# Patient Record
Sex: Male | Born: 1950 | Hispanic: No | Marital: Single | Smoking: Former smoker
Health system: Southern US, Community
[De-identification: ages and names within clinical notes are randomized; demographics above are authoritative.]

## PROBLEM LIST (undated history)

## (undated) DIAGNOSIS — K4090 Unilateral inguinal hernia, without obstruction or gangrene, not specified as recurrent: Secondary | ICD-10-CM

## (undated) DIAGNOSIS — E785 Hyperlipidemia, unspecified: Secondary | ICD-10-CM

## (undated) DIAGNOSIS — K5730 Diverticulosis of large intestine without perforation or abscess without bleeding: Secondary | ICD-10-CM

## (undated) DIAGNOSIS — Z973 Presence of spectacles and contact lenses: Secondary | ICD-10-CM

## (undated) DIAGNOSIS — Z862 Personal history of diseases of the blood and blood-forming organs and certain disorders involving the immune mechanism: Secondary | ICD-10-CM

## (undated) DIAGNOSIS — R030 Elevated blood-pressure reading, without diagnosis of hypertension: Secondary | ICD-10-CM

## (undated) DIAGNOSIS — Z8719 Personal history of other diseases of the digestive system: Secondary | ICD-10-CM

## (undated) HISTORY — DX: Hyperlipidemia, unspecified: E78.5

## (undated) HISTORY — PX: ANAL FISSURE REPAIR: SHX2312

## (undated) HISTORY — PX: HERNIA REPAIR: SHX51

## (undated) HISTORY — PX: OTHER SURGICAL HISTORY: SHX169

## (undated) HISTORY — PX: INGUINAL HERNIA REPAIR: SUR1180

## (undated) HISTORY — PX: COLONOSCOPY WITH PROPOFOL: SHX5780

## (undated) HISTORY — PX: TREATMENT FISTULA ANAL: SUR1390

---

## 2006-04-04 ENCOUNTER — Ambulatory Visit (HOSPITAL_COMMUNITY): Admission: RE | Admit: 2006-04-04 | Discharge: 2006-04-04 | Payer: Self-pay | Admitting: General Surgery

## 2007-04-25 ENCOUNTER — Emergency Department (HOSPITAL_COMMUNITY): Admission: EM | Admit: 2007-04-25 | Discharge: 2007-04-25 | Payer: Self-pay | Admitting: Emergency Medicine

## 2008-07-08 ENCOUNTER — Encounter: Admission: RE | Admit: 2008-07-08 | Discharge: 2008-07-08 | Payer: Self-pay | Admitting: Family Medicine

## 2010-06-18 NOTE — Op Note (Signed)
NAME:  Marc Tyler, Marc Tyler                ACCOUNT NO.:  [id number]   MEDICAL RECORD NO.:  [id number]          PATIENT TYPE:  AMB   LOCATION:  DAY                          FACILITY:  [facility]   PHYSICIAN:  Timothy E. Earlene Plater, M.D. 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   POSTOPERATIVE DIAGNOSIS:  Fistula in ano, hemorrhoids.   PROCEDURE:  Repair of fistula.   SURGEON:  Kendrick Ranch, M.D.   ANESTHESIA:  General.   [name] has been seen and followed in the office for some weeks.  He  began with a incision and drainage of a external thrombosed hemorrhoid  by his primary care which apparently later developed some element of  infection.  He was seen several times in the office.  I could not  distinguish pathology, however he did demonstrate a superficial fistula  in ano from the left lateral to the left anterior anoderm.  Because of  his concern and symptoms, he wants it repaired and that has been  carefully discussed with him on several occasions.  Today he is seen,  identified, and the permit signed.   He is taken to the operating room, placed supine.  General LMA  anesthesia provided.  He was placed in lithotomy.  Perianal area  inspected with magnification.  Anoscopy carried out.  He had prominent  external hemorrhoids, left lateral, right posterior but these were  known, the internal component was very slight.  In the left anterior the  junction of the rectal mucosa and anoderm was an exaggerated crypt.  I  inserted a small lacrimal duct cannula and that exited under the skin to  the left lateral perianal skin.  The tissue over this was divided with  cautery.  The resulting fistula tract was well cauterized.  There were  no complications.  No other evidence of disease process or infection.  This procedure was complete.  Gelfoam gauze and dry sterile dressing  applied.  Counts correct.  He tolerated it well, was removed to recovery  room.  The patient has Vicodin at home, instructions.      Timothy E. Earlene Plater, M.D.  Electronically Signed     TED/MEDQ  D:  04/04/2006  T:  04/04/2006  Job:  [id number]   cc:   [employer]

## 2010-10-25 LAB — BASIC METABOLIC PANEL
Chloride: 100
GFR calc Af Amer: >60
GFR calc non Af Amer: >60
Potassium: 4.4

## 2010-10-25 LAB — DIFFERENTIAL
Eosinophils Absolute: 0.1
Eosinophils Relative: 1
Lymphocytes Relative: 13
Lymphs Abs: 1.4
Monocytes Relative: 4
Neutrophils Relative %: 83 — ABNORMAL HIGH

## 2010-10-25 LAB — CBC
HCT: 48
MCV: 94.1
RBC: 5.1
WBC: 11.2 — ABNORMAL HIGH

## 2010-10-25 LAB — ETHANOL: Alcohol, Ethyl (B): <5

## 2014-08-01 DIAGNOSIS — Z86718 Personal history of other venous thrombosis and embolism: Secondary | ICD-10-CM

## 2014-08-01 HISTORY — DX: Personal history of other venous thrombosis and embolism: Z86.718

## 2014-08-22 ENCOUNTER — Encounter (HOSPITAL_COMMUNITY): Payer: Self-pay | Admitting: *Deleted

## 2014-08-22 ENCOUNTER — Inpatient Hospital Stay (HOSPITAL_COMMUNITY)
Admission: AD | Admit: 2014-08-22 | Discharge: 2014-08-27 | DRG: 441 | Disposition: A | Discharge status: Home / Self Care | Payer: 59 | Source: Other Acute Inpatient Hospital | Attending: Internal Medicine | Admitting: Internal Medicine

## 2014-08-22 DIAGNOSIS — E440 Moderate protein-calorie malnutrition: Secondary | ICD-10-CM

## 2014-08-22 DIAGNOSIS — Z87891 Personal history of nicotine dependence: Secondary | ICD-10-CM | POA: Diagnosis not present

## 2014-08-22 DIAGNOSIS — R160 Hepatomegaly, not elsewhere classified: Secondary | ICD-10-CM | POA: Diagnosis not present

## 2014-08-22 DIAGNOSIS — Z6823 Body mass index (BMI) 23.0-23.9, adult: Secondary | ICD-10-CM | POA: Diagnosis not present

## 2014-08-22 DIAGNOSIS — K769 Liver disease, unspecified: Principal | ICD-10-CM | POA: Diagnosis present

## 2014-08-22 DIAGNOSIS — R1084 Generalized abdominal pain: Secondary | ICD-10-CM | POA: Diagnosis not present

## 2014-08-22 DIAGNOSIS — F419 Anxiety disorder, unspecified: Secondary | ICD-10-CM | POA: Diagnosis not present

## 2014-08-22 DIAGNOSIS — D638 Anemia in other chronic diseases classified elsewhere: Secondary | ICD-10-CM | POA: Diagnosis not present

## 2014-08-22 DIAGNOSIS — C787 Secondary malignant neoplasm of liver and intrahepatic bile duct: Secondary | ICD-10-CM | POA: Diagnosis not present

## 2014-08-22 DIAGNOSIS — E43 Unspecified severe protein-calorie malnutrition: Secondary | ICD-10-CM | POA: Diagnosis present

## 2014-08-22 DIAGNOSIS — D630 Anemia in neoplastic disease: Secondary | ICD-10-CM | POA: Diagnosis present

## 2014-08-22 DIAGNOSIS — I81 Portal vein thrombosis: Secondary | ICD-10-CM | POA: Diagnosis present

## 2014-08-22 DIAGNOSIS — R7989 Other specified abnormal findings of blood chemistry: Secondary | ICD-10-CM | POA: Diagnosis not present

## 2014-08-22 DIAGNOSIS — R109 Unspecified abdominal pain: Secondary | ICD-10-CM | POA: Diagnosis present

## 2014-08-22 DIAGNOSIS — E785 Hyperlipidemia, unspecified: Secondary | ICD-10-CM | POA: Diagnosis not present

## 2014-08-22 MED ORDER — FAMOTIDINE 20 MG PO TABS: 20.0000 mg | ORAL_TABLET | Freq: Every day | ORAL | Status: DC | PRN

## 2014-08-22 MED ORDER — PANTOPRAZOLE SODIUM 40 MG PO TBEC
80.0000 mg | DELAYED_RELEASE_TABLET | Freq: Every day | ORAL | Status: DC
  Administered 2014-08-23 – 2014-08-27 (×5): 80 mg via ORAL
  Filled 2014-08-22 (×5): qty 2

## 2014-08-22 MED ORDER — ENSURE ENLIVE PO LIQD
237.0000 mL | Freq: Two times a day (BID) | ORAL | Status: DC
  Administered 2014-08-23 – 2014-08-26 (×6): 237 mL via ORAL

## 2014-08-22 MED ORDER — HYDROCODONE-ACETAMINOPHEN 5-325 MG PO TABS
1.0000 | ORAL_TABLET | ORAL | Status: DC | PRN
  Administered 2014-08-25: 2 via ORAL
  Administered 2014-08-26: 1 via ORAL
  Filled 2014-08-22: qty 1
  Filled 2014-08-22: qty 2

## 2014-08-22 MED ORDER — ATORVASTATIN CALCIUM 10 MG PO TABS
20.0000 mg | ORAL_TABLET | Freq: Every day | ORAL | Status: DC
  Administered 2014-08-22 – 2014-08-27 (×6): 20 mg via ORAL
  Filled 2014-08-22 (×6): qty 2

## 2014-08-22 MED ORDER — BUPROPION HCL ER (XL) 150 MG PO TB24
150.0000 mg | ORAL_TABLET | Freq: Every day | ORAL | Status: DC
  Administered 2014-08-23: 150 mg via ORAL
  Filled 2014-08-22 (×3): qty 1

## 2014-08-22 MED ORDER — ALPRAZOLAM 0.25 MG PO TABS
0.2500 mg | ORAL_TABLET | Freq: Three times a day (TID) | ORAL | Status: DC | PRN
  Administered 2014-08-22 – 2014-08-24 (×5): 0.5 mg via ORAL
  Filled 2014-08-22 (×5): qty 2

## 2014-08-22 MED ORDER — HEPARIN (PORCINE) IN NACL 100-0.45 UNIT/ML-% IJ SOLN
1200.0000 [IU]/h | INTRAMUSCULAR | Status: DC
Start: 2014-08-22 — End: 2014-08-23
  Administered 2014-08-22: 1200 [IU]/h via INTRAVENOUS

## 2014-08-22 NOTE — Progress Notes (Signed)
64 year old gentleman presented to [facility] center with 3 weeks of abdominal pain. On arrival and further evaluation with imaging revealed, multiple liver masses/ lesions and portal vein thrombosis. There ia an area of thickened sigmoid colon.  He was started on IV heparin and requesting transfer to [facility] for evaluation of malignancy and colonoscopy. Will need GI consult and oncology consult on arrival.   Hosie Poisson, MD [phone].

## 2014-08-22 NOTE — H&P (Signed)
[facility] History and Physical  Marc Tyler MVE:[id number] DOB: 1950-12-19 DOA: 08/22/2014  Referring physician: EDP PCP: No primary care provider on file.   Chief Complaint: Abdominal pain   HPI: Marc Tyler is a 64 y.o. male who presented to [facility] with 3 week history of abdominal pain.  Work up at [facility] included CT abd/pelvis which revealed multiple liver lesions / masses, as well as portal vein thrombosis.  Has area of sigmoid colon with thickening (? Primary). Patient transferred to [facility] for availability of GI and ultimately oncology consults.  Review of Systems: Systems reviewed.  As above, otherwise negative  No past medical history on file. No past surgical history on file. Social History:  has no tobacco, alcohol, and drug history on file.  No Known Allergies  No family history on file.   Prior to Admission medications   Medication Sig Start Date End Date Taking? Authorizing Provider  ALPRAZolam Duanne Moron) 0.25 MG tablet Take 1-2 tablets by mouth 3 (three) times daily as needed. anxiety 08/19/14  Yes Historical Provider, MD  Ascorbic Acid (VITAMIN C PO) Take 1 tablet by mouth daily.   Yes Historical Provider, MD  atorvastatin (LIPITOR) 20 MG tablet Take 20 mg by mouth daily. 08/09/14  Yes Historical Provider, MD  buPROPion (WELLBUTRIN XL) 150 MG 24 hr tablet Take 1 tablet by mouth daily. 08/14/14  Yes Historical Provider, MD  famotidine (PEPCID) 20 MG tablet Take 20 mg by mouth daily as needed for heartburn or indigestion.   Yes Historical Provider, MD  Melatonin 3 MG CAPS Take 1 capsule by mouth at bedtime as needed (sleep).   Yes Historical Provider, MD  Multiple Vitamins-Minerals (MULTIVITAMIN WITH MINERALS) tablet Take 1 tablet by mouth daily.   Yes Historical Provider, MD  omeprazole (PRILOSEC) 20 MG capsule Take 20 mg by mouth daily.   Yes Historical Provider, MD   Physical Exam: Filed Vitals:   08/22/14 2046  BP: 129/73  Pulse: 91  Temp: 97.6 F  (36.4 C)  Resp: 16    BP 129/73 mmHg  Pulse 91  Temp(Src) 97.6 F (36.4 C) (Oral)  Resp 16  Ht 5\' 11"  (1.803 m)  Wt 76.613 kg (168 lb 14.4 oz)  BMI 23.57 kg/m2  SpO2 100%  General Appearance:    Alert, oriented, no distress, appears stated age  Head:    Normocephalic, atraumatic  Eyes:    PERRL, EOMI, sclera non-icteric        Nose:   Nares without drainage or epistaxis. Mucosa, turbinates normal  Throat:   Moist mucous membranes. Oropharynx without erythema or exudate.  Neck:   Supple. No carotid bruits.  No thyromegaly.  No lymphadenopathy.   Back:     No CVA tenderness, no spinal tenderness  Lungs:     Clear to auscultation bilaterally, without wheezes, rhonchi or rales  Chest wall:    No tenderness to palpitation  Heart:    Regular rate and rhythm without murmurs, gallops, rubs  Abdomen:     Soft, non-tender, nondistended, normal bowel sounds, no organomegaly  Genitalia:    deferred  Rectal:    deferred  Extremities:   No clubbing, cyanosis or edema.  Pulses:   2+ and symmetric all extremities  Skin:   Skin color, texture, turgor normal, no rashes or lesions  Lymph nodes:   Cervical, supraclavicular, and axillary nodes normal  Neurologic:   CNII-XII intact. Normal strength, sensation and reflexes      throughout    Labs on  Admission:  Basic Metabolic Panel: No results for input(s): NA, K, CL, CO2, GLUCOSE, BUN, CREATININE, CALCIUM, MG, PHOS in the last 168 hours. Liver Function Tests: No results for input(s): AST, ALT, ALKPHOS, BILITOT, PROT, ALBUMIN in the last 168 hours. No results for input(s): LIPASE, AMYLASE in the last 168 hours. No results for input(s): AMMONIA in the last 168 hours. CBC: No results for input(s): WBC, NEUTROABS, HGB, HCT, MCV, PLT in the last 168 hours. Cardiac Enzymes: No results for input(s): CKTOTAL, CKMB, CKMBINDEX, TROPONINI in the last 168 hours.  BNP (last 3 results) No results for input(s): PROBNP in the last 8760 hours. CBG: No  results for input(s): GLUCAP in the last 168 hours.  Radiological Exams on Admission: No results found.  EKG: Independently reviewed.  Assessment/Plan Principal Problem:   Liver masses Active Problems:   Portal vein thrombosis   1. Liver masses - primary liver vs mets from another primary malignancy (eg sigmoid colon cancer as he has thickening of the sigmoid colon on CT scan). 1. Needs GI consult in AM re: liver biopsy vs colonoscopy (primary possibly in sigmoid colon given area of thickening on CT scan). 2. Norco for pain 3. Will leave on diet for now 2. Portal vein thrombosis -  1. Heparin gtt per pharm consult    Code Status: Full Code  Family Communication: No family in room Disposition Plan: Admit to inpatient   Time spent: 70 min  GARDNER, JARED M. [facility] Pager [phone]  If 7AM-7PM, please contact the day team taking care of the patient [email or web address] Password [email or web address] 08/22/2014, 9:30 PM

## 2014-08-22 NOTE — Progress Notes (Signed)
ANTICOAGULATION CONSULT NOTE - Initial Consult  Pharmacy Consult for Heparin Indication: portal vein thrombosis  No Known Allergies  Patient Measurements: Height: 5\' 11"  (180.3 cm) Weight: 168 lb 14.4 oz (76.613 kg) IBW/kg (Calculated) : 75.3 Heparin Dosing Weight: using actual body weight  Vital Signs: Temp: 97.6 F (36.4 C) (07/22 2046) Temp Source: Oral (07/22 2046) BP: 129/73 mmHg (07/22 2046) Pulse Rate: 91 (07/22 2046)  Labs: No results for input(s): HGB, HCT, PLT, APTT, LABPROT, INR, HEPARINUNFRC, CREATININE, CKTOTAL, CKMB, TROPONINI in the last 72 hours.  CrCl cannot be calculated (Patient has no serum creatinine result on file.).   Medical History: No past medical history on file.   Assessment: 19 yoM presented to [facility] center with 3 weeks of abdominal pain. Found to have multiple liver masses/lesions, thickened sigmoid colon, and extensive portal vein thrombosis. He was started on IV heparin and transferred [facility] for evaluation of malignancy and colonoscopy.  Pharmacy consulted to continue management of heparin.  Per Care Everywhere, appears that patient was given a 4500 unit bolus of heparin and the infusion was ordered.  RN reports that patient arrived with infusion running at 1200 units/hr.  Unknown time of start.  Care Everywhere Labs  Hgb 9.0, Platelets 413  SCr 0.89, CrCl~93 ml/min  No baseline anticoagulation labs available  Goal of Therapy:  Heparin level 0.3-0.7 units/ml Monitor platelets by anticoagulation protocol: Yes   Plan:  Waiting on RN to call back to confirm heparin rate.  Will place inpatient order for this and order heparin level.   Hershal Coria 08/22/2014,9:38 PM

## 2014-08-23 ENCOUNTER — Encounter (HOSPITAL_COMMUNITY): Payer: Self-pay | Admitting: *Deleted

## 2014-08-23 DIAGNOSIS — R7989 Other specified abnormal findings of blood chemistry: Secondary | ICD-10-CM

## 2014-08-23 DIAGNOSIS — R1084 Generalized abdominal pain: Secondary | ICD-10-CM

## 2014-08-23 LAB — CBC
HEMATOCRIT: 23 % — AB (ref 39.0–52.0)
HEMOGLOBIN: 7.6 g/dL — AB (ref 13.0–17.0)
MCH: 31.1 pg (ref 26.0–34.0)
MCHC: 33 g/dL (ref 30.0–36.0)
MCV: 94.3 fL (ref 78.0–100.0)
PLATELETS: 366 10*3/uL (ref 150–400)
RBC: 2.44 MIL/uL — ABNORMAL LOW (ref 4.22–5.81)
RDW: 14.7 % (ref 11.5–15.5)
WBC: 11.1 10*3/uL — ABNORMAL HIGH (ref 4.0–10.5)

## 2014-08-23 LAB — COMPREHENSIVE METABOLIC PANEL
ALBUMIN: 1.9 g/dL — AB (ref 3.5–5.0)
ALT: 57 U/L (ref 17–63)
AST: 46 U/L — AB (ref 15–41)
Alkaline Phosphatase: 224 U/L — ABNORMAL HIGH (ref 38–126)
Anion gap: 6 (ref 5–15)
BILIRUBIN TOTAL: 2.7 mg/dL — AB (ref 0.3–1.2)
BUN: 14 mg/dL (ref 6–20)
CALCIUM: 7.9 mg/dL — AB (ref 8.9–10.3)
CHLORIDE: 103 mmol/L (ref 101–111)
CO2: 25 mmol/L (ref 22–32)
CREATININE: 0.92 mg/dL (ref 0.61–1.24)
GFR calc Af Amer: >60 mL/min (ref >60)
Glucose, Bld: 130 mg/dL — ABNORMAL HIGH (ref 65–99)
Potassium: 4.2 mmol/L (ref 3.5–5.1)
SODIUM: 134 mmol/L — AB (ref 135–145)
Total Protein: 6.1 g/dL — ABNORMAL LOW (ref 6.5–8.1)

## 2014-08-23 LAB — HEPARIN LEVEL (UNFRACTIONATED)
HEPARIN UNFRACTIONATED: 0.2 [IU]/mL — AB (ref 0.30–0.70)
Heparin Unfractionated: 0.12 IU/mL — ABNORMAL LOW (ref 0.30–0.70)

## 2014-08-23 LAB — ABO/RH: ABO/RH(D): O POS

## 2014-08-23 LAB — PREPARE RBC (CROSSMATCH)

## 2014-08-23 MED ORDER — GUAIFENESIN 100 MG/5ML PO SOLN
200.0000 mg | ORAL | Status: DC | PRN
  Administered 2014-08-23 – 2014-08-24 (×5): 200 mg via ORAL
  Filled 2014-08-23 (×6): qty 10

## 2014-08-23 MED ORDER — SODIUM CHLORIDE 0.9 % IV SOLN
Freq: Once | INTRAVENOUS | Status: AC
  Administered 2014-08-23: 18:00:00 via INTRAVENOUS

## 2014-08-23 MED ORDER — HEPARIN BOLUS VIA INFUSION
1500.0000 [IU] | Freq: Once | INTRAVENOUS | Status: AC
Start: 2014-08-23 — End: 2014-08-23
  Administered 2014-08-23: 1500 [IU] via INTRAVENOUS
  Filled 2014-08-23: qty 1500

## 2014-08-23 MED ORDER — HEPARIN (PORCINE) IN NACL 100-0.45 UNIT/ML-% IJ SOLN
1750.0000 [IU]/h | INTRAMUSCULAR | Status: DC
  Administered 2014-08-23: 1350 [IU]/h via INTRAVENOUS
  Filled 2014-08-23: qty 250

## 2014-08-23 NOTE — Progress Notes (Signed)
ANTICOAGULATION CONSULT NOTE - Follow up  Pharmacy Consult for Heparin Indication: portal vein thrombosis  No Known Allergies  Patient Measurements: Height: 5\' 11"  (180.3 cm) Weight: 168 lb 14.4 oz (76.613 kg) IBW/kg (Calculated) : 75.3 Heparin Dosing Weight: using actual body weight  Vital Signs: Temp: 97.8 F (36.6 C) (07/23 0443) Temp Source: Oral (07/23 0443) BP: 116/70 mmHg (07/23 0443) Pulse Rate: 91 (07/23 0443)  Labs:  Recent Labs  08/23/14 0300 08/23/14 1203  HGB 7.6*  --   HCT 23.0*  --   PLT 366  --   HEPARINUNFRC <0.10* 0.12*  CREATININE 0.92  --     Estimated Creatinine Clearance: 88.7 mL/min (by C-G formula based on Cr of 0.92).   Medical History: Past Medical History  Diagnosis Date  . Hyperlipidemia    Assessment: 7 yoM presented to [facility] center with 3 weeks of abdominal pain. Found to have multiple liver masses/lesions, thickened sigmoid colon, and extensive portal vein thrombosis. He was started on IV heparin and transferred to [facility] for evaluation of malignancy and colonoscopy. Pharmacy consulted to continue management of heparin. Per Care Everywhere, appears that he was given a 4500 unit bolus of heparin and the infusion was ordered.  RN reports that patient arrived with infusion running at 1200 units/hr. Unknown time of start.   Heparin level remains subtherapeutic after rebolus and rate increase to 1350units/hr. No bleeding reported/documented. Per RN, no problems with the IV site and no interruptions in the infusion.  Goal of Therapy:  Heparin level 0.3-0.7 units/ml Monitor platelets by anticoagulation protocol: Yes   Plan:  Increase heparin infusion to 1750units/hr.  Recheck heparin level in 6 hours and daily thereafter. Check CBC q24h while on heparin. F/u daily.  Romeo Rabon, PharmD, pager [phone]. 08/23/2014,12:54 PM.

## 2014-08-23 NOTE — Progress Notes (Signed)
ANTICOAGULATION CONSULT NOTE - Follow Up Consult  Pharmacy Consult for Heparin Indication: portal vein thrombosis  No Known Allergies  Patient Measurements: Height: 5\' 11"  (180.3 cm) Weight: 168 lb 14.4 oz (76.613 kg) IBW/kg (Calculated) : 75.3 Heparin Dosing Weight:   Vital Signs: Temp: 97.8 F (36.6 C) (07/23 0443) Temp Source: Oral (07/23 0443) BP: 116/70 mmHg (07/23 0443) Pulse Rate: 91 (07/23 0443)  Labs:  Recent Labs  08/23/14 0300  HGB 7.6*  HCT 23.0*  PLT 366  HEPARINUNFRC <0.10*  CREATININE 0.92    Estimated Creatinine Clearance: 88.7 mL/min (by C-G formula based on Cr of 0.92).   Medications:  Infusions:  . heparin 1,200 Units/hr (08/22/14 2230)  . heparin      Assessment: Patient with low heparin level.  No heparin issues per RN.    Goal of Therapy:  Heparin level 0.3-0.7 units/ml Monitor platelets by anticoagulation protocol: Yes   Plan:  Increase heparin to 1350 units/hr Rebolus 1500 units iv x1 Recheck level at [address] [place] 08/23/2014,4:53 AM

## 2014-08-23 NOTE — Progress Notes (Signed)
Utilization Review completed. Adham Johnson RN BSN CM 

## 2014-08-23 NOTE — Progress Notes (Addendum)
Patient ID: Marc Tyler, male   DOB: Jul 04, 1950, 64 y.o.   MRN: [id number] [facility] PROGRESS NOTE  Marc Tyler [id number] DOB: 31-Oct-1950 DOA: 08/22/2014 PCP:  Will ask CM to provide info on [facility]  Brief narrative:    64 y.o. male with past medical history of dyslipidemia, has had colonoscopy about 5 years ago and said it was normal. He says he has been feeling very weak over last 3 weeks, has had gnawing type of abdominal pain which initially he contributed to anxiety. The pain was generalized, intermittent and has caused him to have poor po intake. As a result, he has lost 10 lbs in last 2 weeks. He was seen by his PCP when he started having these issues and was even prescribed abx for possible pneumonia. He presented from [place] for further evaluation of multiple liver lesions and masses seen on CT abdomen along with portal vein thrombosis. He was started on heparin drip.  Barrier to discharge: will need liver biopsy for now. He will continue heparin drip for portal vein thrombosis. Once biopsy results are available he will need oncology consultation for treatment/management plan.   Assessment/Plan:    Principal Problem: Abdominal pain / Liver lesions concerning for metastases  - On CT scan, there was an area of sigmoid colon thickening and multiple liver lesion concerning for metastatic disease.  - IR consulted for biopsy of liver lesion(s) - Spoke with Gi, they recommend liver biopsy prior to other diagnostic studies. - Continue pain management efforts  Active Problems: Portal vein thrombosis - Continue heparin drip  Anemia of chronic disease - Likely in the setting of malignancy - Will give 1 unit PRBC now  - Check CBC tomorrow am  Abnormal liver function tests - Likely from liver mets   DVT Prophylaxis  - On heparin drip    Code Status: Full.  Family Communication:  plan of care discussed with the patient and his friend at the bedside   Disposition Plan: Home once the work is completed, liver biopsy, ? colonoscopy  IV access:  Peripheral IV  Procedures and diagnostic studies:    No results found.  Medical Consultants:  Interventional radiology - for liver biopsy GI - Dr. Paulita Fujita  Other Consultants:  None  IAnti-Infectives:   None    Leisa Lenz, MD  [facility] Pager [phone]  Time spent in minutes: 25 minutes  If 7PM-7AM, please contact night-coverage [email or web address] Password [facility] 08/23/2014, 2:39 PM   LOS: 1 day    HPI/Subjective: No acute overnight events. Patient reports abdominal pain is controlled.   Objective: Filed Vitals:   08/22/14 1900 08/22/14 2046 08/23/14 0443  BP: 126/73 129/73 116/70  Pulse: 104 91 91  Temp: 97.9 F (36.6 C) 97.6 F (36.4 C) 97.8 F (36.6 C)  TempSrc: Oral Oral Oral  Resp: 16 16 16   Height: 5\' 11"  (1.803 m)    Weight: 76.613 kg (168 lb 14.4 oz)    SpO2: 100% 100% 100%    Intake/Output Summary (Last 24 hours) at 08/23/14 1439 Last data filed at 08/23/14 0313  Gross per 24 hour  Intake      0 ml  Output    400 ml  Net   -400 ml    Exam:   General:  Pt is alert, follows commands appropriately, not in acute distress  Cardiovascular: Regular rate and rhythm, S1/S2, no murmurs  Respiratory: Clear to auscultation bilaterally, no wheezing, no crackles, no rhonchi  Abdomen: Soft, non tender,  non distended, bowel sounds present  Extremities: No edema, pulses DP and PT palpable bilaterally  Neuro: Grossly nonfocal  Data Reviewed: Basic Metabolic Panel:  Recent Labs Lab 08/23/14 0300  NA 134*  K 4.2  CL 103  CO2 25  GLUCOSE 130*  BUN 14  CREATININE 0.92  CALCIUM 7.9*   Liver Function Tests:  Recent Labs Lab 08/23/14 0300  AST 46*  ALT 57  ALKPHOS 224*  BILITOT 2.7*  PROT 6.1*  ALBUMIN 1.9*   No results for input(s): LIPASE, AMYLASE in the last 168 hours. No results for input(s): AMMONIA in the last 168  hours. CBC:  Recent Labs Lab 08/23/14 0300  WBC 11.1*  HGB 7.6*  HCT 23.0*  MCV 94.3  PLT 366   Cardiac Enzymes: No results for input(s): CKTOTAL, CKMB, CKMBINDEX, TROPONINI in the last 168 hours. BNP: Invalid input(s): POCBNP CBG: No results for input(s): GLUCAP in the last 168 hours.  No results found for this or any previous visit (from the past 240 hour(s)).   Scheduled Meds: . atorvastatin  20 mg Oral Daily  . buPROPion  150 mg Oral Daily  . feeding supplement (ENSURE ENLIVE)  237 mL Oral BID BM  . pantoprazole  80 mg Oral Daily   Continuous Infusions: . heparin 1,750 Units/hr (08/23/14 1301)

## 2014-08-23 NOTE — Progress Notes (Signed)
Pt explained to patient the need for blood transfusion as requested by MD. Pt very hesitant and unsure if he should agree. Dr. Charlies Silvers paged to come speak with patient. Type and screen collected and sent in the mean time. Pt given consent for to review.

## 2014-08-24 ENCOUNTER — Encounter (HOSPITAL_COMMUNITY): Payer: Self-pay | Admitting: Radiology

## 2014-08-24 DIAGNOSIS — R160 Hepatomegaly, not elsewhere classified: Secondary | ICD-10-CM

## 2014-08-24 DIAGNOSIS — E440 Moderate protein-calorie malnutrition: Secondary | ICD-10-CM

## 2014-08-24 DIAGNOSIS — C787 Secondary malignant neoplasm of liver and intrahepatic bile duct: Secondary | ICD-10-CM | POA: Diagnosis present

## 2014-08-24 DIAGNOSIS — I81 Portal vein thrombosis: Secondary | ICD-10-CM

## 2014-08-24 LAB — CBC
HEMATOCRIT: 26.5 % — AB (ref 39.0–52.0)
HEMOGLOBIN: 8.6 g/dL — AB (ref 13.0–17.0)
MCH: 30.6 pg (ref 26.0–34.0)
MCHC: 32.5 g/dL (ref 30.0–36.0)
MCV: 94.3 fL (ref 78.0–100.0)
Platelets: 355 10*3/uL (ref 150–400)
RBC: 2.81 MIL/uL — ABNORMAL LOW (ref 4.22–5.81)
RDW: 15 % (ref 11.5–15.5)
WBC: 10.8 10*3/uL — AB (ref 4.0–10.5)

## 2014-08-24 LAB — HEPARIN LEVEL (UNFRACTIONATED)
HEPARIN UNFRACTIONATED: 0.41 [IU]/mL (ref 0.30–0.70)
Heparin Unfractionated: 0.34 IU/mL (ref 0.30–0.70)

## 2014-08-24 MED ORDER — HEPARIN (PORCINE) IN NACL 100-0.45 UNIT/ML-% IJ SOLN
1950.0000 [IU]/h | INTRAMUSCULAR | Status: DC
  Administered 2014-08-24 – 2014-08-26 (×6): 1950 [IU]/h via INTRAVENOUS
  Filled 2014-08-24 (×6): qty 250

## 2014-08-24 MED ORDER — BUPROPION HCL ER (XL) 300 MG PO TB24
300.0000 mg | ORAL_TABLET | Freq: Every day | ORAL | Status: DC
  Administered 2014-08-24 – 2014-08-27 (×4): 300 mg via ORAL
  Filled 2014-08-24 (×4): qty 1

## 2014-08-24 NOTE — Progress Notes (Signed)
ANTICOAGULATION CONSULT NOTE - Follow Up Consult  Pharmacy Consult for Heparin Indication: portal vein thrombosis  No Known Allergies  Patient Measurements: Height: 5\' 11"  (180.3 cm) Weight: 168 lb 14.4 oz (76.613 kg) IBW/kg (Calculated) : 75.3 Heparin Dosing Weight:   Vital Signs: Temp: 99.9 F (37.7 C) (07/23 2101) Temp Source: Oral (07/23 2101) BP: 128/74 mmHg (07/23 2101) Pulse Rate: 88 (07/23 2101)  Labs:  Recent Labs  08/23/14 0300 08/23/14 1203 08/23/14 2303  HGB 7.6*  --   --   HCT 23.0*  --   --   PLT 366  --   --   HEPARINUNFRC <0.10* 0.12* 0.20*  CREATININE 0.92  --   --     Estimated Creatinine Clearance: 88.7 mL/min (by C-G formula based on Cr of 0.92).   Medications:  Infusions:  . heparin      Assessment: Patient with heparin level low.  No heparin issues per RN.  Goal of Therapy:  Heparin level 0.3-0.7 units/ml Monitor platelets by anticoagulation protocol: Yes   Plan:  Increase heparin to 1950 units/hr Recheck level at [place] 08/24/2014,12:32 AM

## 2014-08-24 NOTE — Care Management Note (Signed)
Case Management Note  Patient Details  Name: Marc Tyler MRN: [id number] Date of Birth: [birth year]  Subjective/Objective:                    Action/Plan:  Discharge planning   Expected Discharge Date:                  Expected Discharge Plan:  Home/Self Care  In-House Referral:     Discharge planning Services  CM Consult [facility] per physician request)  Post Acute Care Choice:    Choice offered to:     DME Arranged:    DME Agency:     HH Arranged:    [place] Agency:     Status of Service:  Completed, signed off  Medicare Important Message Given:    Date Medicare IM Given:    Medicare IM give by:    Date Additional Medicare IM Given:    Additional Medicare Important Message give by:     If discussed at [place] of Stay Meetings, dates discussed:    Additional Comments: CM spoke with patient at the bedside. Provided patient with the [place] information sheet. Patient states he has a PCP.  Apolonio Schneiders, RN 08/24/2014, 11:37 AM

## 2014-08-24 NOTE — Progress Notes (Signed)
ANTICOAGULATION CONSULT NOTE - Follow up  Pharmacy Consult for Heparin Indication: portal vein thrombosis  No Known Allergies  Patient Measurements: Height: 5\' 11"  (180.3 cm) Weight: 168 lb 14.4 oz (76.613 kg) IBW/kg (Calculated) : 75.3 Heparin Dosing Weight: using actual body weight  Vital Signs: Temp: 98.8 F (37.1 C) (07/24 0454) Temp Source: Oral (07/24 0454) BP: 123/80 mmHg (07/24 0454) Pulse Rate: 99 (07/24 0454)  Labs:  Recent Labs  08/23/14 0300  08/23/14 2303 08/24/14 0432 08/24/14 0827 08/24/14 1404  HGB 7.6*  --   --  8.6*  --   --   HCT 23.0*  --   --  26.5*  --   --   PLT 366  --   --  355  --   --   HEPARINUNFRC <0.10*  < > 0.20*  --  0.34 0.41  CREATININE 0.92  --   --   --   --   --   < > = values in this interval not displayed.  Estimated Creatinine Clearance: 88.7 mL/min (by C-G formula based on Cr of 0.92).   Medical History: Past Medical History  Diagnosis Date  . Hyperlipidemia    Assessment: 65 yoM presented to [facility] center with 3 weeks of abdominal pain. Found to have multiple liver masses/lesions, thickened sigmoid colon, and extensive portal vein thrombosis. He was started on IV heparin and transferred to [facility] for evaluation of malignancy and colonoscopy. Pharmacy consulted to continue management of heparin.    Heparin level therapeutic x 2 on 1950units/hr.   CBC improved post-transfusion. No bleeding reported/documented.  Per RN, no problems with the IV site and no interruptions in the infusion.  Goal of Therapy:  Heparin level 0.3-0.7 units/ml Monitor platelets by anticoagulation protocol: Yes   Plan:  Cont heparin infusion at 1950units/hr.  Daily heparin level. Check CBC q24h while on heparin. F/u daily.  Romeo Rabon, PharmD, pager [phone]. 08/24/2014,2:37 PM.

## 2014-08-24 NOTE — H&P (Signed)
Chief Complaint: Patient was seen in consultation today for liver lesions at the request of TRH/GI  Referring Physician(s): Dr. Charlies Silvers  History of Present Illness: Marc Tyler is a 64 y.o. male who presented after being seen in [place] for c/o fatigue, weight loss and abdominal pain. CT done at [facility] revealed multiple liver lesions and portal vein thrombosis, the patient was placed on IV heparin and transferred to [facility] for further workup-GI contacted and recommended biopsy. IR received request for biopsy. The patient admits to vague abdominal pain, denies any RUQ pain. He denies any chest pain, shortness of breath or palpitations. He denies any active signs of bleeding or excessive bruising. He denies any recent fever or chills. The patient denies any history of sleep apnea or chronic oxygen use. He has previously tolerated sedation without complications.     Past Medical History  Diagnosis Date  . Hyperlipidemia     Past Surgical History  Procedure Laterality Date  . Hernia repair      Allergies: Review of patient's allergies indicates no known allergies.  Medications: Prior to Admission medications   Medication Sig Start Date End Date Taking? Authorizing Provider  ALPRAZolam Duanne Moron) 0.25 MG tablet Take 1-2 tablets by mouth 3 (three) times daily as needed. anxiety 08/19/14  Yes Historical Provider, MD  Ascorbic Acid (VITAMIN C PO) Take 1 tablet by mouth daily.   Yes Historical Provider, MD  atorvastatin (LIPITOR) 20 MG tablet Take 20 mg by mouth daily. 08/09/14  Yes Historical Provider, MD  buPROPion (WELLBUTRIN XL) 150 MG 24 hr tablet Take 1 tablet by mouth daily. 08/14/14  Yes Historical Provider, MD  famotidine (PEPCID) 20 MG tablet Take 20 mg by mouth daily as needed for heartburn or indigestion.   Yes Historical Provider, MD  Melatonin 3 MG CAPS Take 1 capsule by mouth at bedtime as needed (sleep).   Yes Historical Provider, MD  Multiple Vitamins-Minerals  (MULTIVITAMIN WITH MINERALS) tablet Take 1 tablet by mouth daily.   Yes Historical Provider, MD  omeprazole (PRILOSEC) 20 MG capsule Take 20 mg by mouth daily.   Yes Historical Provider, MD     History reviewed. No pertinent family history.  History   Social History  . Marital Status: Single    Spouse Name: N/A  . Number of Children: N/A  . Years of Education: N/A   Social History Main Topics  . Smoking status: Former Research scientist (life sciences)  . Smokeless tobacco: Not on file  . Alcohol Use: Not on file  . Drug Use: Not on file  . Sexual Activity: Not on file   Other Topics Concern  . None   Social History Narrative   Review of Systems: A 12 point ROS discussed and pertinent positives are indicated in the HPI above.  All other systems are negative.  Review of Systems  Vital Signs: BP 123/80 mmHg  Pulse 99  Temp(Src) 98.8 F (37.1 C) (Oral)  Resp 18  Ht 5\' 11"  (1.803 m)  Wt 168 lb 14.4 oz (76.613 kg)  BMI 23.57 kg/m2  SpO2 98%  Physical Exam  Constitutional: He is oriented to person, place, and time. No distress.  HENT:  Head: Normocephalic and atraumatic.  Neck: No tracheal deviation present.  Cardiovascular: Normal rate and regular rhythm.  Exam reveals no gallop and no friction rub.   No murmur heard. Pulmonary/Chest: Effort normal and breath sounds normal. No respiratory distress. He has no wheezes. He has no rales.  Abdominal: Soft. Bowel sounds are normal.  He exhibits no distension. There is no tenderness.  Neurological: He is alert and oriented to person, place, and time.  Skin: Skin is warm and dry. He is not diaphoretic.    Mallampati Score:  MD Evaluation Airway: WNL Heart: WNL Abdomen: WNL Chest/ Lungs: WNL ASA  Classification: 3 Mallampati/Airway Score: Two  Imaging: No results found.  Labs:  CBC:  Recent Labs  08/23/14 0300 08/24/14 0432  WBC 11.1* 10.8*  HGB 7.6* 8.6*  HCT 23.0* 26.5*  PLT 366 355    COAGS: No results for input(s): INR,  APTT in the last 8760 hours.  BMP:  Recent Labs  08/23/14 0300  NA 134*  K 4.2  CL 103  CO2 25  GLUCOSE 130*  BUN 14  CALCIUM 7.9*  CREATININE 0.92  GFRNONAA >60  GFRAA >60    LIVER FUNCTION TESTS:  Recent Labs  08/23/14 0300  BILITOT 2.7*  AST 46*  ALT 57  ALKPHOS 224*  PROT 6.1*  ALBUMIN 1.9*    Assessment and Plan: Generalized fatigue Abdominal pain with weight loss [facility] CT done revealed multiple liver lesions and portal vein thrombosis -CT disc in review with interventional radiologist Request for image guided biopsy Portal vein thrombosis on IV heparin- will need to be held prior to biopsy if lesions are amendable to percutaneous approach The patient will be NPO after midnight in case of procedure, on IV heparin will need to be turned off prior to procedure if done, labs and vitals have been reviewed, check INR Risks and Benefits discussed with the patient including, but not limited to bleeding, infection, damage to adjacent structures or low yield requiring additional tests. All of the patient's questions were answered, patient is agreeable to proceed. Consent signed and in chart.   Thank you for this interesting consult.  I greatly enjoyed meeting Juelz Whittenberg and look forward to participating in their care.  A copy of this report was sent to the requesting provider on this date.  SignedHedy Jacob 08/24/2014, 10:54 AM   I spent a total of 40 Minutes in face to face in clinical consultation, greater than 50% of which was counseling/coordinating care for liver lesions.

## 2014-08-24 NOTE — Progress Notes (Signed)
ANTICOAGULATION CONSULT NOTE - Follow up  Pharmacy Consult for Heparin Indication: portal vein thrombosis  No Known Allergies  Patient Measurements: Height: 5\' 11"  (180.3 cm) Weight: 168 lb 14.4 oz (76.613 kg) IBW/kg (Calculated) : 75.3 Heparin Dosing Weight: using actual body weight  Vital Signs: Temp: 98.8 F (37.1 C) (07/24 0454) Temp Source: Oral (07/24 0454) BP: 123/80 mmHg (07/24 0454) Pulse Rate: 99 (07/24 0454)  Labs:  Recent Labs  08/23/14 0300 08/23/14 1203 08/23/14 2303 08/24/14 0432 08/24/14 0827  HGB 7.6*  --   --  8.6*  --   HCT 23.0*  --   --  26.5*  --   PLT 366  --   --  355  --   HEPARINUNFRC <0.10* 0.12* 0.20*  --  0.34  CREATININE 0.92  --   --   --   --     Estimated Creatinine Clearance: 88.7 mL/min (by C-G formula based on Cr of 0.92).   Medical History: Past Medical History  Diagnosis Date  . Hyperlipidemia    Assessment: 32 yoM presented to [facility] center with 3 weeks of abdominal pain. Found to have multiple liver masses/lesions, thickened sigmoid colon, and extensive portal vein thrombosis. He was started on IV heparin and transferred to [facility] for evaluation of malignancy and colonoscopy. Pharmacy consulted to continue management of heparin.    Heparin level therapeutic on 1950units/hr.   CBC improved post-transfusion. No bleeding reported/documented.  Per RN, no problems with the IV site and no interruptions in the infusion.  Goal of Therapy:  Heparin level 0.3-0.7 units/ml Monitor platelets by anticoagulation protocol: Yes   Plan:  Cont heparin infusion at 1950units/hr.  Check a confirmatory heparin level in 6 hours per protocol. Check CBC q24h while on heparin. F/u daily.  Romeo Rabon, PharmD, pager [phone]. 08/24/2014,9:42 AM.

## 2014-08-24 NOTE — Progress Notes (Addendum)
[facility] PROGRESS NOTE  Marc Tyler PZW:[id number] DOB: Dec 01, 1950 DOA: 08/22/2014 PCP: No primary care provider on file.  Brief Summary  64 y.o. male with past medical history of dyslipidemia, has had colonoscopy about 5 years ago and said it was normal. He says he has been feeling very weak over last 3 weeks, has had gnawing type of abdominal pain which initially he contributed to anxiety. The pain was generalized, intermittent and has caused him to have poor po intake. As a result, he has lost 10 lbs in last 2 weeks. He was seen by his PCP when he started having these issues and was even prescribed abx for possible pneumonia. He presented from [place] [facility] for further evaluation of multiple liver lesions and masses seen on CT abdomen along with portal vein thrombosis. He was started on heparin drip.  Barrier to discharge:  Pending liver biopsy tomorrow.  Continuing heparin drip for portal vein thrombosis.  Once biopsy results are available he will need oncology consultation for treatment/management plan.    Assessment/Plan  Principal Problem: Abdominal pain / Liver lesions concerning for metastases  - On CT scan, there was an area of sigmoid colon thickening and multiple liver lesion concerning for metastatic disease.  - IR consulted for biopsy of liver lesion(s) - Spoke with Gi, they recommend liver biopsy prior to other diagnostic studies. - Continue pain management efforts - Patient is very concerned about chemotherapy since he already feels "wiped out." - Case discussed with Dr. Alen Blew who recommended against further testing until biopsy (and possibly colonoscopy) is done and oncology will f/u as outpatient  Active Problems: Portal vein thrombosis - Continue heparin drip  Anemia of chronic disease, likely due to malignancy - hemoglobin increased appropriately with blood transfusion 1 unit on 7/23   Abnormal liver function tests - Likely from liver  mets  Severe protein calorie malnutrition with 10-lbs weight loss over last few weeks -  Regular diet -  Supplements -  Nutrition consultation  Generalized weakness -  PT evaluation  Diet:  Regular, NPO at MN Access:  PIV IVF:  off Proph:  Heparin gtt  Code Status: full Family Communication: patient alone Disposition Plan: pending liver biopsy   Consultants:  Radiology  Procedures:  none  Antibiotics:  none   HPI/Subjective:  Continues to have poor appetite.  Denies SOB, diarrhea.  Has some abdominal discomfort.      Objective: Filed Vitals:   08/23/14 1745 08/23/14 1811 08/23/14 2101 08/24/14 0454  BP: 134/74 127/69 128/74 123/80  Pulse: 84 95 88 99  Temp: 98.4 F (36.9 C) 99.2 F (37.3 C) 99.9 F (37.7 C) 98.8 F (37.1 C)  TempSrc: Oral Oral Oral Oral  Resp: 16 17 18 18   Height:      Weight:      SpO2: 100% 99% 100% 98%    Intake/Output Summary (Last 24 hours) at 08/24/14 1451 Last data filed at 08/24/14 0600  Gross per 24 hour  Intake 1045.74 ml  Output    700 ml  Net 345.74 ml   Filed Weights   08/22/14 1900  Weight: 76.613 kg (168 lb 14.4 oz)   Body mass index is 23.57 kg/(m^2).  Exam:   General:  Adult male, No acute distress, flat affect  HEENT:  NCAT, MMM  Cardiovascular:  RRR, nl S1, S2 no mrg, 2+ pulses, warm extremities.  Tele:  NSR  Respiratory:  Course bilateral rales that clear somewhat with repeat respirations, no increased WOB  Abdomen:   NABS, soft, nondistended, TTP in the epigastrium and umbilical area without rebound or guarding  MSK:   Normal tone and bulk, no LEE  Neuro:  Grossly intact  Data Reviewed: Basic Metabolic Panel:  Recent Labs Lab 08/23/14 0300  NA 134*  K 4.2  CL 103  CO2 25  GLUCOSE 130*  BUN 14  CREATININE 0.92  CALCIUM 7.9*   Liver Function Tests:  Recent Labs Lab 08/23/14 0300  AST 46*  ALT 57  ALKPHOS 224*  BILITOT 2.7*  PROT 6.1*  ALBUMIN 1.9*   No results for  input(s): LIPASE, AMYLASE in the last 168 hours. No results for input(s): AMMONIA in the last 168 hours. CBC:  Recent Labs Lab 08/23/14 0300 08/24/14 0432  WBC 11.1* 10.8*  HGB 7.6* 8.6*  HCT 23.0* 26.5*  MCV 94.3 94.3  PLT 366 355    No results found for this or any previous visit (from the past 240 hour(s)).   Studies: No results found.  Scheduled Meds: . atorvastatin  20 mg Oral Daily  . buPROPion  300 mg Oral Daily  . feeding supplement (ENSURE ENLIVE)  237 mL Oral BID BM  . pantoprazole  80 mg Oral Daily   Continuous Infusions: . heparin 1,950 Units/hr (08/24/14 0225)    Principal Problem:   Liver masses Active Problems:   Portal vein thrombosis   Liver metastases    Time spent: 30 min    Ambrea Hegler, [facility]  [facility] Pager [phone]. If 7PM-7AM, please contact night-coverage at [email or web address], password [facility] 08/24/2014, 2:51 PM  LOS: 2 days

## 2014-08-24 NOTE — Progress Notes (Signed)
Last night, patient asked to not be disturbed, so RN and NT only went in room when needed to get vitals or give medications or when patient called.

## 2014-08-25 ENCOUNTER — Inpatient Hospital Stay (HOSPITAL_COMMUNITY): Payer: 59

## 2014-08-25 DIAGNOSIS — C801 Malignant (primary) neoplasm, unspecified: Secondary | ICD-10-CM

## 2014-08-25 DIAGNOSIS — E43 Unspecified severe protein-calorie malnutrition: Secondary | ICD-10-CM | POA: Insufficient documentation

## 2014-08-25 DIAGNOSIS — C787 Secondary malignant neoplasm of liver and intrahepatic bile duct: Secondary | ICD-10-CM

## 2014-08-25 LAB — TYPE AND SCREEN
ABO/RH(D): O POS
Antibody Screen: NEGATIVE
UNIT DIVISION: 0

## 2014-08-25 LAB — CBC
HCT: 27.6 % — ABNORMAL LOW (ref 39.0–52.0)
Hemoglobin: 8.7 g/dL — ABNORMAL LOW (ref 13.0–17.0)
MCH: 30.2 pg (ref 26.0–34.0)
MCHC: 31.5 g/dL (ref 30.0–36.0)
MCV: 95.8 fL (ref 78.0–100.0)
PLATELETS: 374 10*3/uL (ref 150–400)
RBC: 2.88 MIL/uL — ABNORMAL LOW (ref 4.22–5.81)
RDW: 15.1 % (ref 11.5–15.5)
WBC: 8.9 10*3/uL (ref 4.0–10.5)

## 2014-08-25 LAB — COMPREHENSIVE METABOLIC PANEL
ALT: 59 U/L (ref 17–63)
AST: 46 U/L — ABNORMAL HIGH (ref 15–41)
Albumin: 2 g/dL — ABNORMAL LOW (ref 3.5–5.0)
Alkaline Phosphatase: 257 U/L — ABNORMAL HIGH (ref 38–126)
Anion gap: 8 (ref 5–15)
BILIRUBIN TOTAL: 1.9 mg/dL — AB (ref 0.3–1.2)
BUN: 16 mg/dL (ref 6–20)
CHLORIDE: 105 mmol/L (ref 101–111)
CO2: 25 mmol/L (ref 22–32)
Calcium: 8.3 mg/dL — ABNORMAL LOW (ref 8.9–10.3)
Creatinine, Ser: 1.02 mg/dL (ref 0.61–1.24)
GFR calc Af Amer: >60 mL/min (ref >60)
GLUCOSE: 105 mg/dL — AB (ref 65–99)
Potassium: 4.1 mmol/L (ref 3.5–5.1)
SODIUM: 138 mmol/L (ref 135–145)
TOTAL PROTEIN: 6.5 g/dL (ref 6.5–8.1)

## 2014-08-25 LAB — PROTIME-INR
INR: 1.12 (ref 0.00–1.49)
PROTHROMBIN TIME: 14.6 s (ref 11.6–15.2)

## 2014-08-25 LAB — HEPARIN LEVEL (UNFRACTIONATED): Heparin Unfractionated: 0.47 IU/mL (ref 0.30–0.70)

## 2014-08-25 MED ORDER — MIDAZOLAM HCL 2 MG/2ML IJ SOLN
INTRAMUSCULAR | Status: AC | PRN
  Administered 2014-08-25 (×2): 0.5 mg via INTRAVENOUS
  Administered 2014-08-25: 1 mg via INTRAVENOUS

## 2014-08-25 MED ORDER — FENTANYL CITRATE (PF) 100 MCG/2ML IJ SOLN
INTRAMUSCULAR | Status: AC
  Filled 2014-08-25: qty 2

## 2014-08-25 MED ORDER — FENTANYL CITRATE (PF) 100 MCG/2ML IJ SOLN
INTRAMUSCULAR | Status: AC | PRN
  Administered 2014-08-25: 50 ug via INTRAVENOUS
  Administered 2014-08-25 (×2): 25 ug via INTRAVENOUS

## 2014-08-25 MED ORDER — MIDAZOLAM HCL 2 MG/2ML IJ SOLN
INTRAMUSCULAR | Status: AC
  Filled 2014-08-25: qty 4

## 2014-08-25 MED ORDER — FENTANYL CITRATE (PF) 100 MCG/2ML IJ SOLN
INTRAMUSCULAR | Status: AC
  Filled 2014-08-25: qty 4

## 2014-08-25 MED ORDER — MIDAZOLAM HCL 2 MG/2ML IJ SOLN
INTRAMUSCULAR | Status: AC
  Filled 2014-08-25: qty 6

## 2014-08-25 NOTE — Progress Notes (Signed)
ANTICOAGULATION CONSULT NOTE - Follow up  Pharmacy Consult for Heparin Indication: portal vein thrombosis  No Known Allergies  Patient Measurements: Height: 5\' 11"  (180.3 cm) Weight: 168 lb 14.4 oz (76.613 kg) IBW/kg (Calculated) : 75.3 Heparin Dosing Weight: using actual body weight  Vital Signs: Temp: 98.5 F (36.9 C) (07/25 0513) Temp Source: Oral (07/25 0513) BP: 116/71 mmHg (07/25 0513) Pulse Rate: 87 (07/25 0513)  Labs:  Recent Labs  08/23/14 0300  08/24/14 0432 08/24/14 0827 08/24/14 1404 08/25/14 0455  HGB 7.6*  --  8.6*  --   --  8.7*  HCT 23.0*  --  26.5*  --   --  27.6*  PLT 366  --  355  --   --  374  LABPROT  --   --   --   --   --  14.6  INR  --   --   --   --   --  1.12  HEPARINUNFRC <0.10*  < >  --  0.34 0.41 0.47  CREATININE 0.92  --   --   --   --  1.02  < > = values in this interval not displayed.  Estimated Creatinine Clearance: 80 mL/min (by C-G formula based on Cr of 1.02).   Medical History: Past Medical History  Diagnosis Date  . Hyperlipidemia    Assessment: 10 yoM presented to [facility] center with 3 weeks of abdominal pain. Found to have multiple liver masses/lesions, thickened sigmoid colon, and extensive portal vein thrombosis. He was started on IV heparin and transferred to [facility] for evaluation of malignancy and colonoscopy. Pharmacy consulted to continue management of heparin.    Today, 08/25/2014:  Heparin level = 0.47 (therapeutic) on 1950units/hr.   CBC improved post-transfusion. No bleeding reported/documented.  No bleeding noted  IR to perform biospy  Goal of Therapy:  Heparin level 0.3-0.7 units/ml Monitor platelets by anticoagulation protocol: Yes   Plan:   Continue heparin infusion at 1950units/hr.   Daily heparin levels and CBC  Follow plans for biopsy and plans to hold then resume heparin for procedure  Await long-term plans for anticoagulation  Doreene Eland, PharmD, BCPS.   Pager: [phone]  08/25/2014,7:10 AM.

## 2014-08-25 NOTE — Progress Notes (Signed)
Patient back to room via bed from IR post liver biopsy.  Band-aid to right side clean, dry, intact.  Vital signs stable.  No complaint.  Explained to patient to be on bedrest for 3 hours.  No complaint of pain.  Per IR, Marc Tyler, resume heparin drip at same rate at 2000

## 2014-08-25 NOTE — Procedures (Signed)
Successful RT HEPATIC MASS 18 G BORE BX  NO COMP STABLE FULL REPORT IN PACS

## 2014-08-25 NOTE — Progress Notes (Addendum)
Patient ID: Marc Tyler, male   DOB: 22-Feb-1950, 64 y.o.   MRN: [id number] [facility] PROGRESS [facility]  Alaster Asfaw KDT:[id number] DOB: Feb 19, 1950 DOA: 08/22/2014 PCP: Dr. Lona Kettle, [facility]   Brief narrative:    64 y.o. male with past medical history of dyslipidemia, has had colonoscopy about 5 years ago and said it was normal. He says he has been feeling very weak over last 3 weeks, has had gnawing type of abdominal pain which initially he contributed to anxiety. The pain was generalized, intermittent and has caused him to have poor po intake. As a result, he has lost 10 lbs in last 2 weeks. He was seen by his PCP when he started having these issues and was even prescribed abx for possible pneumonia. He presented from [place] [facility] for further evaluation of multiple liver lesions and masses seen on CT abdomen along with portal vein thrombosis. He was started on heparin drip.  Barrier to discharge: Pending liver biopsy. Anticipated discharge 08/27/2014 once liver biopsy results available.  Assessment/Plan:    Principal Problem: Abdominal pain / Liver lesions concerning for metastases  - CT scan demonstrated area of sigmoid colon thickening and multiple liver lesion concerning for metastatic disease.  - IR consulted for biopsy of liver lesion, will be done today - After liver biopsy we'll see if GI plans to do colonoscopy.  Active Problems: Portal vein thrombosis - Continue heparin drip  Anemia of chronic disease - Likely secondary to malignancy - Patient is status post 1 unit of PRBC transfusion 08/23/2014  Abnormal liver function tests - Likely from liver mets  Severe protein calorie malnutrition  - In the context of chronic illness.  - Nutrition consulted  Generalized weakness - Likely from ongoing medical issues. He ambulates without assistance.   DVT Prophylaxis  -Heparin drip    Code Status: Full.  Family Communication:  plan of care  discussed with the patient and his friend Juliann Pulse at the bedside  Disposition Plan: Home likely by 08/27/2014.   IV access:  Peripheral IV  Procedures and diagnostic studies:    No results found.  Medical Consultants:  IR  Other Consultants:  None   IAnti-Infectives:   None    Leisa Lenz, MD  [facility] Pager [phone]  Time spent in minutes: 15 minutes  If 7PM-7AM, please contact night-coverage [email or web address] Password [email or web address] 08/25/2014, 1:51 PM   LOS: 3 days    HPI/Subjective: No acute overnight events. Patient reports feeling better this morning  Objective: Filed Vitals:   08/24/14 0454 08/24/14 1603 08/24/14 2153 08/25/14 0513  BP: 123/80 122/77 130/80 116/71  Pulse: 99 93 93 87  Temp: 98.8 F (37.1 C) 98.1 F (36.7 C) 98.4 F (36.9 C) 98.5 F (36.9 C)  TempSrc: Oral Oral Oral Oral  Resp: 18 18 18 18   Height:      Weight:      SpO2: 98% 100% 100% 99%    Intake/Output Summary (Last 24 hours) at 08/25/14 1351 Last data filed at 08/25/14 0933  Gross per 24 hour  Intake  448.5 ml  Output   2725 ml  Net -2276.5 ml    Exam:   General:  Pt is alert, follows commands appropriately, not in acute distress  Cardiovascular: Regular rate and rhythm, S1/S2, no murmurs  Respiratory: Clear to auscultation bilaterally, no wheezing, no crackles, no rhonchi  Abdomen: Soft, non tender, non distended, bowel sounds present  Extremities: No edema, pulses DP and PT palpable bilaterally  Neuro: Grossly nonfocal  Data Reviewed: Basic Metabolic Panel:  Recent Labs Lab 08/23/14 0300 08/25/14 0455  NA 134* 138  K 4.2 4.1  CL 103 105  CO2 25 25  GLUCOSE 130* 105*  BUN 14 16  CREATININE 0.92 1.02  CALCIUM 7.9* 8.3*   Liver Function Tests:  Recent Labs Lab 08/23/14 0300 08/25/14 0455  AST 46* 46*  ALT 57 59  ALKPHOS 224* 257*  BILITOT 2.7* 1.9*  PROT 6.1* 6.5  ALBUMIN 1.9* 2.0*   No results for input(s): LIPASE, AMYLASE in the last 168  hours. No results for input(s): AMMONIA in the last 168 hours. CBC:  Recent Labs Lab 08/23/14 0300 08/24/14 0432 08/25/14 0455  WBC 11.1* 10.8* 8.9  HGB 7.6* 8.6* 8.7*  HCT 23.0* 26.5* 27.6*  MCV 94.3 94.3 95.8  PLT 366 355 374   Cardiac Enzymes: No results for input(s): CKTOTAL, CKMB, CKMBINDEX, TROPONINI in the last 168 hours. BNP: Invalid input(s): POCBNP CBG: No results for input(s): GLUCAP in the last 168 hours.  No results found for this or any previous visit (from the past 240 hour(s)).   Scheduled Meds: . atorvastatin  20 mg Oral Daily  . buPROPion  300 mg Oral Daily  . feeding supplement (ENSURE ENLIVE)  237 mL Oral BID BM  . pantoprazole  80 mg Oral Daily   Continuous Infusions: . heparin 1,950 Units/hr (08/25/14 1017)

## 2014-08-25 NOTE — Progress Notes (Signed)
Initial Nutrition Assessment  DOCUMENTATION CODES:   Severe malnutrition in context of acute illness/injury  INTERVENTION:  - Continue Ensure Enlive po BID, each supplement provides 350 kcal and 20 grams of protein - RD will continue to monitor for needs  NUTRITION DIAGNOSIS:   Increased nutrient needs related to catabolic illness, cancer and cancer related treatments as evidenced by estimated needs.  GOAL:   Patient will meet greater than or equal to 90% of their needs  MONITOR:   PO intake, Supplement acceptance, Weight trends, Labs, I & O's  REASON FOR ASSESSMENT:   Consult Assessment of nutrition requirement/status  ASSESSMENT:  64 y.o. male who presented after being seen in [place] for c/o fatigue, weight loss and abdominal pain. CT done at [facility] revealed multiple liver lesions and portal vein thrombosis, the patient was placed on IV heparin and transferred to [facility] for further workup-GI contacted and recommended biopsy. IR received request for biopsy. The patient admits to vague abdominal pain, denies any RUQ pain.  Pt seen for consult. BMI indicates normal weight status. Pt NPO pending liver biopsy at 1400 today. Per rounds this AM, pt with new dx of liver cancer with mets. He reports that PTA he was experiencing a knot-like feeling in his stomach which was not unusual for him as he has experienced this feeling with stress and anxiety since he was 64 years-old. He states that he had a wellness exam 1 month ago. Knot feeling in stomach did not resolve as it did usually and that he was mainly consuming items such as Ensure and applesauce. Pain was not any better or worse after intakes.   He states that his UBW is ~180 lbs and that current weight loss occurred over the past 3 weeks. This indicates 12 lb weight loss (7% body weight) in 3 weeks which is significant for time frame. Moderate muscle and mild fat wasting also noted. He asks about nutrition needs moving forward.  This RD informed him that inpatient RD would see him in the hospital and that when he goes to outpatient cancer center he can request appointment with RD at that facility.  Not meeting needs.  Medications reviewed. Labs reviewed; Ca: 8.3 mg/dL.   Diet Order:  Diet NPO time specified Except for: Sips with Meds  Skin:  Reviewed, no issues  Last BM:  PTA  Height:   Ht Readings from Last 1 Encounters:  08/22/14 5\' 11"  (1.803 m)    Weight:   Wt Readings from Last 1 Encounters:  08/22/14 168 lb 14.4 oz (76.613 kg)    Ideal Body Weight:  78.18 kg (kg)  Wt Readings from Last 10 Encounters:  08/22/14 168 lb 14.4 oz (76.613 kg)    BMI:  Body mass index is 23.57 kg/(m^2).  Estimated Nutritional Needs:   Kcal:  2637-8588  Protein:  90-100 grams  Fluid:  2.2 L/day  EDUCATION NEEDS:   No education needs identified at this time     Jarome Matin, RD, LDN Inpatient Clinical Dietitian Pager # [phone] After hours/weekend pager # [phone]

## 2014-08-25 NOTE — Progress Notes (Signed)
Heparin drip stopped per Marc Tyler, IR.  Marc Tyler. In pharmacy was made aware.

## 2014-08-26 DIAGNOSIS — D638 Anemia in other chronic diseases classified elsewhere: Secondary | ICD-10-CM

## 2014-08-26 LAB — CBC
HCT: 31.4 % — ABNORMAL LOW (ref 39.0–52.0)
Hemoglobin: 10.3 g/dL — ABNORMAL LOW (ref 13.0–17.0)
MCH: 32.1 pg (ref 26.0–34.0)
MCHC: 32.8 g/dL (ref 30.0–36.0)
MCV: 97.8 fL (ref 78.0–100.0)
PLATELETS: 444 10*3/uL — AB (ref 150–400)
RBC: 3.21 MIL/uL — ABNORMAL LOW (ref 4.22–5.81)
RDW: 15.4 % (ref 11.5–15.5)
WBC: 13.2 10*3/uL — ABNORMAL HIGH (ref 4.0–10.5)

## 2014-08-26 LAB — COMPREHENSIVE METABOLIC PANEL
ALT: 57 U/L (ref 17–63)
ANION GAP: 10 (ref 5–15)
AST: 44 U/L — AB (ref 15–41)
Albumin: 2.3 g/dL — ABNORMAL LOW (ref 3.5–5.0)
Alkaline Phosphatase: 279 U/L — ABNORMAL HIGH (ref 38–126)
BUN: 17 mg/dL (ref 6–20)
CHLORIDE: 105 mmol/L (ref 101–111)
CO2: 24 mmol/L (ref 22–32)
Calcium: 8.6 mg/dL — ABNORMAL LOW (ref 8.9–10.3)
Creatinine, Ser: 0.88 mg/dL (ref 0.61–1.24)
GFR calc Af Amer: >60 mL/min (ref >60)
GLUCOSE: 115 mg/dL — AB (ref 65–99)
POTASSIUM: 4.2 mmol/L (ref 3.5–5.1)
Sodium: 139 mmol/L (ref 135–145)
Total Bilirubin: 1.9 mg/dL — ABNORMAL HIGH (ref 0.3–1.2)
Total Protein: 7 g/dL (ref 6.5–8.1)

## 2014-08-26 LAB — HEPARIN LEVEL (UNFRACTIONATED): HEPARIN UNFRACTIONATED: 0.38 [IU]/mL (ref 0.30–0.70)

## 2014-08-26 MED ORDER — RIVAROXABAN 15 MG PO TABS
15.0000 mg | ORAL_TABLET | Freq: Two times a day (BID) | ORAL | Status: DC
  Administered 2014-08-27: 15 mg via ORAL
  Filled 2014-08-26: qty 1

## 2014-08-26 NOTE — Progress Notes (Signed)
ANTICOAGULATION CONSULT NOTE - Initial Consult  Pharmacy Consult for Xarelto Indication: portal vein thrombosis  No Known Allergies  Patient Measurements: Height: 5\' 11"  (180.3 cm) Weight: 168 lb 14.4 oz (76.613 kg) IBW/kg (Calculated) : 75.3   Vital Signs: Temp: 97.6 F (36.4 C) (07/26 2034) Temp Source: Oral (07/26 2034) BP: 150/75 mmHg (07/26 2034) Pulse Rate: 92 (07/26 2034)  Labs:  Recent Labs  08/24/14 0432  08/24/14 1404 08/25/14 0455 08/26/14 0422  HGB 8.6*  --   --  8.7* 10.3*  HCT 26.5*  --   --  27.6* 31.4*  PLT 355  --   --  374 444*  LABPROT  --   --   --  14.6  --   INR  --   --   --  1.12  --   HEPARINUNFRC  --   < > 0.41 0.47 0.38  CREATININE  --   --   --  1.02 0.88  < > = values in this interval not displayed.  Estimated Creatinine Clearance: 92.7 mL/min (by C-G formula based on Cr of 0.88).   Medical History: Past Medical History  Diagnosis Date  . Hyperlipidemia    Assessment: 45 yoM presented to [facility] with 3 weeks of abdominal pain. Found to have multiple liver masses/lesions, thickened sigmoid colon, and extensive portal vein thrombosis. He was started on IV heparin and transferred to [facility] for evaluation of malignancy and colonoscopy. Pharmacy consulted to continue management of heparin.    Today, 08/26/2014:  Heparin level = 0.47 (therapeutic) on 1950units/hr.   CBC improved post-transfusion. No bleeding reported/documented.  No bleeding noted  IR to perform biospy  7/26 PM Update:  Pharmacy consulted to switch IV heparin to Xarelto on 7/27 in anticipation of discharge  Goal of Therapy:  Heparin level 0.3-0.7 units/ml Monitor platelets by anticoagulation protocol: Yes   Plan:   Continue heparin infusion at 1950units/hr.   D/C heparin infusion @ 8am on 7/27  At 8am on 7/27 begin Xarelto 15mg  PO BID x 21 days followed by 20mg  daily  Leone Haven, PharmD  08/26/2014,9:07 PM.

## 2014-08-26 NOTE — Progress Notes (Signed)
Patient ID: [facility], male   DOB: 27-Jan-1951, 64 y.o.   MRN: [id number] [facility] PROGRESS NOTE  Sherry Rogus PJA:[id number] DOB: 20-Dec-1950 DOA: 08/22/2014 PCP: Dr. Lona Kettle, [facility]   Brief narrative:    64 y.o. male with past medical history of dyslipidemia, has had colonoscopy about 5 years ago and said it was normal. He says he has been feeling very weak over last 3 weeks, has had gnawing type of abdominal pain which initially he contributed to anxiety. The pain was generalized, intermittent and has caused him to have poor po intake. As a result, he has lost 10 lbs in last 2 weeks. He was seen by his PCP when he started having these issues and was even prescribed abx for possible pneumonia. He presented from [facility] for further evaluation of multiple liver lesions and masses seen on CT abdomen along with portal vein thrombosis. He was started on heparin drip.  Barrier to discharge: Pending liver biopsy. Anticipated discharge 08/27/2014 if GI sympotms controlled. He can always follow up with oncology for results. Also, we will start with xarelto for [facility] for portal vein thrombosis on 7/27.  Assessment/Plan:    Principal Problem: Abdominal pain / Liver lesions concerning for metastases  - CT scan demonstrated area of sigmoid colon thickening and multiple liver lesion concerning for metastatic disease.  - IR consulted for biopsy of liver lesion. Biopsy done 7/25. Results are pending. He will follow up with oncology on outpatient basis if results pending by the time he is discharged.   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   Abnormal liver function tests - Likely from liver mets  Severe protein calorie malnutrition  - In the context of chronic  illness.  - Nutrition consulted  Generalized weakness - Likely from ongoing medical issues. He ambulates without assistance.   DVT Prophylaxis  -Heparin drip    Code Status: Full.  Family Communication:  plan of care discussed with the patient and his friend Juliann Pulse at the bedside; I also spoek with his brother Piers over the phone 08/26/14. Disposition Plan: Home likely by 08/27/2014.   IV access:  Peripheral IV  Procedures and diagnostic studies:    US Biopsy 08/25/2014  Successful ultrasound right hepatic mass 18 gauge core biopsies   Medical Consultants:  IR  Other Consultants:  None   IAnti-Infectives:   None    Leisa Lenz, MD  [facility] Pager [phone]  Time spent in minutes: 15 minutes  If 7PM-7AM, please contact night-coverage [email or web address] Password [email or web address] 08/26/2014, 8:45 PM   LOS: 4 days    HPI/Subjective: No acute overnight events. Patient reports feeling nauseous but no vomiting. Says he has poor appetite but eating.    Objective: Filed Vitals:   08/26/14 0115 08/26/14 0411 08/26/14 1325 08/26/14 2034  BP: 129/79 141/73 127/72 150/75  Pulse: 90 99 89 92  Temp: 98.4 F (36.9 C) 98.2 F (36.8 C) 98.4 F (36.9 C) 97.6 F (36.4 C)  TempSrc: Oral Oral Oral Oral  Resp: 18 20 20 18   Height:      Weight:      SpO2: 100% 100% 100% 100%    Intake/Output Summary (Last 24 hours) at 08/26/14 2045 Last data filed at 08/26/14 1850  Gross per 24 hour  Intake  860.73 ml  Output   1750 ml  Net -889.27 ml    Exam:   General:  Pt is alert, no distress  Cardiovascular: Regular rate and rhythm, S1/S2 appreciated   Respiratory: no wheezing, bilateral air entry   Abdomen: non tender, non distended, (+) BS  Extremities: No leg swelling, pulses palpable   Neuro: Nonfocal  Data Reviewed: Basic Metabolic Panel:  Recent Labs Lab 08/23/14 0300 08/25/14 0455 08/26/14 0422  NA 134* 138 139  K 4.2 4.1 4.2  CL 103 105 105  CO2 25 25 24    GLUCOSE 130* 105* 115*  BUN 14 16 17   CREATININE 0.92 1.02 0.88  CALCIUM 7.9* 8.3* 8.6*   Liver Function Tests:  Recent Labs Lab 08/23/14 0300 08/25/14 0455 08/26/14 0422  AST 46* 46* 44*  ALT 57 59 57  ALKPHOS 224* 257* 279*  BILITOT 2.7* 1.9* 1.9*  PROT 6.1* 6.5 7.0  ALBUMIN 1.9* 2.0* 2.3*   No results for input(s): LIPASE, AMYLASE in the last 168 hours. No results for input(s): AMMONIA in the last 168 hours. CBC:  Recent Labs Lab 08/23/14 0300 08/24/14 0432 08/25/14 0455 08/26/14 0422  WBC 11.1* 10.8* 8.9 13.2*  HGB 7.6* 8.6* 8.7* 10.3*  HCT 23.0* 26.5* 27.6* 31.4*  MCV 94.3 94.3 95.8 97.8  PLT 366 355 374 444*   Cardiac Enzymes: No results for input(s): CKTOTAL, CKMB, CKMBINDEX, TROPONINI in the last 168 hours. BNP: Invalid input(s): POCBNP CBG: No results for input(s): GLUCAP in the last 168 hours.  No results found for this or any previous visit (from the past 240 hour(s)).   Scheduled Meds: . atorvastatin  20 mg Oral Daily  . buPROPion  300 mg Oral Daily  . feeding supplement (ENSURE ENLIVE)  237 mL Oral BID BM  . pantoprazole  80 mg Oral Daily   Continuous Infusions: . heparin 1,950 Units/hr (08/26/14 0916)

## 2014-08-26 NOTE — Progress Notes (Signed)
NUTRITION NOTE  Pt seen per pt and RN request. 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  Pt was drinking 4-5 Ensure/day PTA and eating applesauce. Talked with him about ways to increase kcal without adding volume such as using Ensure to make a milkshake with ice cream and peanut butter, adding protein powder to items, and adding sauces, gravies, heavy cream, and extra butter when possible.  He states that he has to force himself to eat and the idea of eating is sometimes repulsive. He states he lives alone. Encouraged him to talk with family and friends about poor appetite and ask them to eat meals with him so that meals are on a set schedule and he has someone to eat with rather than having less "committment" at meal time.   Asked him if he was on medication now or PTA for anxiety and he states he is on Wellbutrin. Talked with him about side effects of some medications and how they can affect appetite. Discussed appetite stimulants with pt about how they can be beneficial for some and encouraged him to talk with his doctor if this is something he is interested in or thinks would be beneficial for him.   Also informed pt that RD should be available at any [facility] he chooses. Pt states he had a friend who went to [facility] and was provided with many handouts and booklets concerning nutrition during treatment.   Pt may benefit from an appetite stimulant after d/c. He is concerned about inadequate nutrition status and inability to start treatment due to this.    [facility], RD, LDN Inpatient Clinical Dietitian Pager # [phone] After hours/weekend pager # [phone]

## 2014-08-26 NOTE — Progress Notes (Signed)
ANTICOAGULATION CONSULT NOTE - Follow Up Consult  Pharmacy Consult for heparin Indication: portal vein thrombosis  No Known Allergies  Patient Measurements: Height: 5\' 11"  (180.3 cm) Weight: 168 lb 14.4 oz (76.613 kg) IBW/kg (Calculated) : 75.3 Heparin Dosing Weight:   Vital Signs: Temp: 98.2 F (36.8 C) (07/26 0411) Temp Source: Oral (07/26 0411) BP: 141/73 mmHg (07/26 0411) Pulse Rate: 99 (07/26 0411)  Labs:  Recent Labs  08/24/14 0432  08/24/14 1404 08/25/14 0455 08/26/14 0422  HGB 8.6*  --   --  8.7* 10.3*  HCT 26.5*  --   --  27.6* 31.4*  PLT 355  --   --  374 444*  LABPROT  --   --   --  14.6  --   INR  --   --   --  1.12  --   HEPARINUNFRC  --   < > 0.41 0.47 0.38  CREATININE  --   --   --  1.02 0.88  < > = values in this interval not displayed.  Estimated Creatinine Clearance: 92.7 mL/min (by C-G formula based on Cr of 0.88).   Medications:  Infusions:  . heparin 1,950 Units/hr (08/25/14 2014)    Assessment: Patient with heparin level at goal again.  No heparin issues noted.  Goal of Therapy:  Heparin level 0.3-0.7 units/ml Monitor platelets by anticoagulation protocol: Yes   Plan:  Continue heparin drip at current rate Recheck level with AM labs  Nani Skillern Crowford 08/26/2014,6:06 AM

## 2014-08-27 ENCOUNTER — Encounter: Payer: Self-pay | Admitting: *Deleted

## 2014-08-27 DIAGNOSIS — E43 Unspecified severe protein-calorie malnutrition: Secondary | ICD-10-CM

## 2014-08-27 DIAGNOSIS — E440 Moderate protein-calorie malnutrition: Secondary | ICD-10-CM

## 2014-08-27 LAB — CBC
HCT: 28.3 % — ABNORMAL LOW (ref 39.0–52.0)
Hemoglobin: 9.3 g/dL — ABNORMAL LOW (ref 13.0–17.0)
MCH: 32.2 pg (ref 26.0–34.0)
MCHC: 32.9 g/dL (ref 30.0–36.0)
MCV: 97.9 fL (ref 78.0–100.0)
Platelets: 394 10*3/uL (ref 150–400)
RBC: 2.89 MIL/uL — ABNORMAL LOW (ref 4.22–5.81)
RDW: 15.4 % (ref 11.5–15.5)
WBC: 9.4 10*3/uL (ref 4.0–10.5)

## 2014-08-27 LAB — COMPREHENSIVE METABOLIC PANEL
ALBUMIN: 2.1 g/dL — AB (ref 3.5–5.0)
ALT: 43 U/L (ref 17–63)
AST: 28 U/L (ref 15–41)
Alkaline Phosphatase: 237 U/L — ABNORMAL HIGH (ref 38–126)
Anion gap: 7 (ref 5–15)
BILIRUBIN TOTAL: 1.6 mg/dL — AB (ref 0.3–1.2)
BUN: 11 mg/dL (ref 6–20)
CHLORIDE: 106 mmol/L (ref 101–111)
CO2: 26 mmol/L (ref 22–32)
Calcium: 8.4 mg/dL — ABNORMAL LOW (ref 8.9–10.3)
Creatinine, Ser: 0.82 mg/dL (ref 0.61–1.24)
GFR calc Af Amer: >60 mL/min (ref >60)
Glucose, Bld: 108 mg/dL — ABNORMAL HIGH (ref 65–99)
Potassium: 3.8 mmol/L (ref 3.5–5.1)
SODIUM: 139 mmol/L (ref 135–145)
Total Protein: 6.5 g/dL (ref 6.5–8.1)

## 2014-08-27 LAB — HEPARIN LEVEL (UNFRACTIONATED): HEPARIN UNFRACTIONATED: 0.64 [IU]/mL (ref 0.30–0.70)

## 2014-08-27 MED ORDER — DEXAMETHASONE 4 MG PO TABS
4.0000 mg | ORAL_TABLET | Freq: Every day | ORAL | Status: DC
  Administered 2014-08-27: 4 mg via ORAL
  Filled 2014-08-27: qty 1

## 2014-08-27 MED ORDER — ENSURE ENLIVE PO LIQD: 237.0000 mL | Freq: Two times a day (BID) | ORAL | Status: DC

## 2014-08-27 MED ORDER — RIVAROXABAN (XARELTO) VTE STARTER PACK (15 & 20 MG): ORAL_TABLET | ORAL | Status: DC

## 2014-08-27 MED ORDER — DEXAMETHASONE 4 MG PO TABS: 4.0000 mg | ORAL_TABLET | Freq: Every day | ORAL | Status: DC

## 2014-08-27 NOTE — Discharge Instructions (Signed)
Liver Biopsy The liver is a large organ in the upper right-hand side of your abdomen. A liver biopsy is a procedure in which a tissue sample is taken from the liver and examined under a microscope. The procedure is done to confirm a suspected problem. There are three types of liver biopsies:  Percutaneous. In this type, an incision is made in your abdomen. The sample is removed through the incision with a needle.  Laparoscopic. In this type, several incisions are made in the abdomen. A tiny camera is passed through one of the incisions to help guide the health care provider. The sample is removed through the other incision or incisions.  Transjugular. In this type, an incision is made in the neck. A tube is passed through the incision to the liver. The sample is removed through the tube with a needle. LET [facility] CARE PROVIDER KNOW ABOUT:  Any allergies you have.  All medicines you are taking, including vitamins, herbs, eye drops, creams, and over-the-counter medicines.  Previous problems you or members of your family have had with the use of anesthetics.  Any blood disorders you have.  Previous surgeries you have had.  Medical conditions you have.  Possibility of pregnancy, if this applies. RISKS AND COMPLICATIONS Generally, this is a safe procedure. However, problems can occur and include:  Bleeding.  Infection.  Bruising.  Collapsed lung.  Leak of digestive juices (bile) from the liver or gallbladder.  Problems with heart rhythm.  Pain at the biopsy site or in the right shoulder.  Low blood pressure (hypotension).  Injury to nearby organs or tissues. BEFORE THE PROCEDURE  Your health care provider may do some blood or urine tests. These will help your health care provider learn how well your kidneys and liver are working and how well your blood clots.  Ask your health care provider if you will be able to go home the day of the procedure. Arrange for someone to  take you home and stay with you for at least 24 hours.  Do not eat or drink anything after midnight on the night before the procedure or as directed by your health care provider.  Ask your health care provider about:  Changing or stopping your regular medicines. This is especially important if you are taking diabetes medicines or blood thinners.  Taking medicines such as aspirin and ibuprofen. These medicines can thin your blood. Do not take these medicines before your procedure if your health care provider asks you not to. PROCEDURE Regardless of the type of biopsy that will be done, you will have an IV line placed. Through this line, you will receive fluids and medicine to relax you. If you will be having a laparoscopic biopsy, you may also receive medicine through this line to make you sleep during the procedure (general anesthetic). Percutaneous Liver Biopsy  You will positioned on your back, with your right hand over your head.  A health care provider will locate your liver by tapping and pressing on the right side of your abdomen or with the help of an ultrasound machine or CT scan.  An area at the bottom of your last right rib will be numbed.  An incision will be made in the numbed area.  The biopsy needle will be inserted into the incision.  Several samples of liver tissue will be taken with the biopsy needle. You will be asked to hold your breath as each sample is taken. Laparoscopic Liver Biopsy  You will be  positioned on your back.  Several small incisions will be made in your abdomen.  Your doctor will pass a tiny camera through one incision. The camera will allow the liver to be viewed on a TV monitor in the operating room.  Tools will be passed through the other incision or incisions. These tools will be used to remove samples of liver tissue. Transjugular Liver Biopsy  You will be positioned on your back on an X-ray table, with your head turned to your left.  An  area on your neck just over your jugular vein will be numbed.  An incision will be made in the numbed area.  A tiny tube will be inserted through the incision. It will be pushed through the jugular vein to a blood vessel in the liver called the hepatic vein.  Dye will be inserted through the tube, and X-rays will be taken. The dye will make the blood vessels in the liver light up on the X-rays.  The biopsy needle will be pushed through the tube until it reaches the liver.  Samples of liver tissue will be taken with the biopsy needle.  The needle and the tube will be removed. After the samples are obtained, the incision or incisions will be closed. AFTER THE PROCEDURE  You will be taken to a recovery area.  You may have to lie on your right side for 1-2 hours. This will prevent bleeding from the biopsy site.  Your progress will be watched. Your blood pressure, pulse, and the biopsy site will be checked often.  You may have some pain or feel sick. If this happens, tell your health care provider.  As you begin to feel better, you will be offered ice and beverages.  You may be allowed to go home when the medicines have worn off and you can walk, drink, eat, and use the bathroom. Document Released: 04/09/2003 Document Revised: 06/03/2013 Document Reviewed: 03/15/2013 [facility] Patient Information 2015 [place], [place]. This information is not intended to replace advice given to you by your health care provider. Make sure you discuss any questions you have with your health care provider.  Information on my medicine - XARELTO (rivaroxaban)  This medication education was reviewed with me or my healthcare representative as part of my discharge preparation.  The pharmacist that spoke with me during my hospital stay was:  Biagio Borg, Ferdinand? Xarelto was prescribed to treat blood clots that may have been found in the veins of your legs (deep  vein thrombosis) or in your lungs (pulmonary embolism) and to reduce the risk of them occurring again.  What do you need to know about Xarelto? The starting dose is one 15 mg tablet taken TWICE daily with food for the FIRST 21 DAYS then on 09/17/14 the dose is changed to one 20 mg tablet taken ONCE A DAY with your evening meal.  DO NOT stop taking Xarelto without talking to the health care provider who prescribed the medication.  Refill your prescription for 20 mg tablets before you run out.  After discharge, you should have regular check-up appointments with your healthcare provider that is prescribing your Xarelto.  In the future your dose may need to be changed if your kidney function changes by a significant amount.  What do you do if you miss a dose? If you are taking Xarelto TWICE DAILY and you miss a dose, take it as soon as you remember. You may take two  15 mg tablets (total 30 mg) at the same time then resume your regularly scheduled 15 mg twice daily the next day.  If you are taking Xarelto ONCE DAILY and you miss a dose, take it as soon as you remember on the same day then continue your regularly scheduled once daily regimen the next day. Do not take two doses of Xarelto at the same time.   Important Safety Information Xarelto is a blood thinner medicine that can cause bleeding. You should call your healthcare provider right away if you experience any of the following: ? Bleeding from an injury or your nose that does not stop. ? Unusual colored urine (red or dark brown) or unusual colored stools (red or black). ? Unusual bruising for unknown reasons. ? A serious fall or if you hit your head (even if there is no bleeding).  Some medicines may interact with Xarelto and might increase your risk of bleeding while on Xarelto. To help avoid this, consult your healthcare provider or pharmacist prior to using any new prescription or non-prescription medications, including herbals,  vitamins, non-steroidal anti-inflammatory drugs (NSAIDs) and supplements.  This website has more information on Xarelto: [email or web address]

## 2014-08-27 NOTE — Discharge Summary (Signed)
Physician Discharge Summary  Marc Tyler EML:[id number] DOB: September 11, 1950 DOA: 08/22/2014  PCP: No primary care provider on file.  Admit date: 08/22/2014 Discharge date: 08/27/2014  Recommendations for Outpatient Follow-up:  1. F/u with the [facility] to review results of liver biopsy which is pending for metastatic cancer 2. Started Xarelto 3. Started dexamethasone  Discharge Diagnoses:  Principal Problem:   Liver masses Active Problems:   Portal vein thrombosis   Liver metastases   Moderate malnutrition   Protein-calorie malnutrition, severe   Discharge Condition: Stable, improved  Diet recommendation: Regular  Wt Readings from Last 3 Encounters:  08/22/14 76.613 kg (168 lb 14.4 oz)    History of present illness:  64 y.o. male with past medical history of dyslipidemia, has had colonoscopy about 5 years ago and said it was normal. He says he has been feeling very weak over last 3 weeks, has had gnawing type of abdominal pain which initially he contributed to anxiety. The pain was generalized, intermittent and has caused him to have poor po intake. As a result, he has lost 10 lbs in last 2 weeks. He was seen by his PCP when he started having these issues and was even prescribed abx for possible pneumonia. He presented from [facility] for further evaluation of multiple liver lesions and masses seen on CT abdomen along with portal vein thrombosis. He was started on heparin drip.  Hospital Course:   Abdominal pain / Liver lesions concerning for metastases.  CT scan demonstrated an area of sigmoid colon thickening and multiple liver lesion concerning for metastatic disease. IR consulted for biopsy of liver lesion. Biopsy done 7/25.  Pathology is pending.  Follow up with oncology in approximately 1 week to review test results and discuss treatment options.  The patient states that he would like to pursue treatment and [facility] Ser once he has discussed treatment  options with our cancer center.  Portal vein thrombosis, initially treated with heparin drip secondary to liver biopsy. He was transitioned to Xarelto and 7/12 without complication.   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  Patient was transfused 1 unit of PRBC transfusion 08/23/2014 and his hemoglobin has remained stable.  Abnormal liver function tests, likely from liver mets.  Please repeat LFTs as outpatient at next visit.  No statins and patient advised to limit tylenol.    Severe protein calorie malnutrition.  He was seen by nutrition and started on supplements.  He was also started on dexamethasone as an appetite stimulant.   Generalized weakness due to ongoing medical issues, however, he was able to ambulate without assistance around the halls.   IV access:  Peripheral IV  Procedures and diagnostic studies:   US Biopsy 08/25/2014 Successful ultrasound right hepatic mass 18 gauge core biopsies   Medical Consultants:  IR  Other Consultants:  None   IAnti-Infectives:   None       Discharge Exam: Filed Vitals:   08/27/14 0554  BP: 136/78  Pulse: 84  Temp: 97.7 F (36.5 C)  Resp: 18   Filed Vitals:   08/26/14 0411 08/26/14 1325 08/26/14 2034 08/27/14 0554  BP: 141/73 127/72 150/75 136/78  Pulse: 99 89 92 84  Temp: 98.2 F (36.8 C) 98.4 F (36.9 C) 97.6 F (36.4 C) 97.7 F (36.5 C)  TempSrc: Oral Oral Oral Oral  Resp: 20 20 18 18   Height:      Weight:      SpO2: 100% 100% 100% 98%  General: adult male, NAD Cardiovascular: RRR, no mrg, 2+ pulses, warm extremities Respiratory: CTAB ABD:  NABS, soft, ND/NT MSK:  No LEE  Discharge Instructions      Discharge Instructions    Call MD for:  difficulty breathing, headache or visual disturbances    Complete by:  As directed      Call MD for:  extreme fatigue    Complete by:  As directed      Call MD for:  hives    Complete by:  As directed      Call MD for:  persistant dizziness or  light-headedness    Complete by:  As directed      Call MD for:  persistant nausea and vomiting    Complete by:  As directed      Call MD for:  redness, tenderness, or signs of infection (pain, swelling, redness, odor or green/yellow discharge around incision site)    Complete by:  As directed      Call MD for:  severe uncontrolled pain    Complete by:  As directed      Call MD for:  temperature >100.4    Complete by:  As directed      Diet general    Complete by:  As directed      Discharge instructions    Complete by:  As directed   You were hospitalized with poor appetite, weakness, nausea and were found to have cancer.  Your biopsy report is pending and you will review the results at your follow up appointment at the cancer center here at [facility], but afterwards, you may transition your care to [facility].  Please STOP your cholesterol medication atorvastatin.  Use dexamethasone to stimulate appetite and phenergan as needed for nausea.  Use supplements as you already have been to try to prevent further weight loss.  For your blood clot in your liver, you have been started on xarelto.  If you have bleeding that does not stop or blood in your stools, or fainting spells, please call 911 right away.  Please talk to your oncologist about continuing your prescription for xarelto.  For the first three weeks, you take the 15mg  tab twice a day.  On August 17th, change to the 20mg  tabs once a day.     Increase activity slowly    Complete by:  As directed             Medication List    STOP taking these medications        atorvastatin 20 MG tablet  Commonly known as:  LIPITOR      TAKE these medications        ALPRAZolam 0.25 MG tablet  Commonly known as:  XANAX  Take 1-2 tablets by mouth 3 (three) times daily as needed. anxiety     buPROPion 150 MG 24 hr tablet  Commonly known as:  WELLBUTRIN XL  Take 1 tablet by mouth daily.     dexamethasone 4 MG tablet  Commonly known as:   DECADRON  Take 1 tablet (4 mg total) by mouth daily.     famotidine 20 MG tablet  Commonly known as:  PEPCID  Take 20 mg by mouth daily as needed for heartburn or indigestion.     feeding supplement (ENSURE ENLIVE) Liqd  Take 237 mLs by mouth 2 (two) times daily between meals.     Melatonin 3 MG Caps  Take 1 capsule by mouth at bedtime as needed (  sleep).     multivitamin with minerals tablet  Take 1 tablet by mouth daily.     omeprazole 20 MG capsule  Commonly known as:  PRILOSEC  Take 20 mg by mouth daily.     Rivaroxaban 15 & 20 MG Tbpk  Commonly known as:  XARELTO STARTER PACK  Take as directed on package: Start with one 15mg  tablet by mouth twice a day with food. On Day 22 (August 17th), switch to one 20mg  tablet once a day with food.     VITAMIN C PO  Take 1 tablet by mouth daily.       Follow-up Information    Schedule an appointment as soon as possible for a visit with Betsy Coder, MD.   Specialty:  Oncology   Contact information:   [place] [address] [phone]       Follow up In 1 week.   Why:  review pathology       The results of significant diagnostics from this hospitalization (including imaging, microbiology, ancillary and laboratory) are listed below for reference.    Significant Diagnostic Studies: US Biopsy  08/25/2014   CLINICAL DATA:  Right hepatic mass concerning for multifocal hepatic malignancy versus metastatic disease.  EXAM: ULTRASOUND GUIDED CORE BIOPSY OF RIGHT HEPATIC MASS  MEDICATIONS: 2.0 mg IV Versed; 100 mcg IV Fentanyl  Total Moderate Sedation Time: 8 minutes  PROCEDURE: The procedure, risks, benefits, and alternatives were explained to the patient. Questions regarding the procedure were encouraged and answered. The patient understands and consents to the procedure.  The right upper quadrant was prepped with ChloraPrep in a sterile fashion, and a sterile drape was applied covering the operative field. A  sterile gown and sterile gloves were used for the procedure. Local anesthesia was provided with 1% Lidocaine.  Previous imaging reviewed from the outside facility. Preliminary ultrasound performed. Hypoechoic solid peripheral right hepatic mass demonstrated by ultrasound in the right upper quadrant. Under sterile conditions and local anesthesia, a 17 gauge 6.8 cm access needle was advanced percutaneously through a lower intercostal space in the mid axillary line. Needle position confirmed with ultrasound. Images obtained for documentation. 5 18 gauge core biopsies obtained. Samples placed in formalin. Needle removed. Needle tract embolized with Gel-Foam pledgets. Postprocedure imaging demonstrates no hemorrhage or hematoma. Patient tolerated the biopsy well.  COMPLICATIONS: None.  FINDINGS: Imaging confirms needle access of the hypoechoic right hepatic mass for core biopsy  IMPRESSION: Successful ultrasound right hepatic mass 18 gauge core biopsies   Electronically Signed   By: Jerilynn Mages.  Shick M.D.   On: 08/25/2014 17:23    Microbiology: No results found for this or any previous visit (from the past 240 hour(s)).   Labs: Basic Metabolic Panel:  Recent Labs Lab 08/23/14 0300 08/25/14 0455 08/26/14 0422 08/27/14 0415  NA 134* 138 139 139  K 4.2 4.1 4.2 3.8  CL 103 105 105 106  CO2 25 25 24 26   GLUCOSE 130* 105* 115* 108*  BUN 14 16 17 11   CREATININE 0.92 1.02 0.88 0.82  CALCIUM 7.9* 8.3* 8.6* 8.4*   Liver Function Tests:  Recent Labs Lab 08/23/14 0300 08/25/14 0455 08/26/14 0422 08/27/14 0415  AST 46* 46* 44* 28  ALT 57 59 57 43  ALKPHOS 224* 257* 279* 237*  BILITOT 2.7* 1.9* 1.9* 1.6*  PROT 6.1* 6.5 7.0 6.5  ALBUMIN 1.9* 2.0* 2.3* 2.1*   No results for input(s): LIPASE, AMYLASE in the last 168 hours. No results for  input(s): AMMONIA in the last 168 hours. CBC:  Recent Labs Lab 08/23/14 0300 08/24/14 0432 08/25/14 0455 08/26/14 0422 08/27/14 0415  WBC 11.1* 10.8* 8.9 13.2*  9.4  HGB 7.6* 8.6* 8.7* 10.3* 9.3*  HCT 23.0* 26.5* 27.6* 31.4* 28.3*  MCV 94.3 94.3 95.8 97.8 97.9  PLT 366 355 374 444* 394   Cardiac Enzymes: No results for input(s): CKTOTAL, CKMB, CKMBINDEX, TROPONINI in the last 168 hours. BNP: BNP (last 3 results) No results for input(s): BNP in the last 8760 hours.  ProBNP (last 3 results) No results for input(s): PROBNP in the last 8760 hours.  CBG: No results for input(s): GLUCAP in the last 168 hours.  Time coordinating discharge: 35 minutes  Signed:  Atthew Coutant  [facility] 08/27/2014, 1:52 PM

## 2014-08-27 NOTE — Progress Notes (Signed)
Oncology Nurse Navigator Documentation  Oncology Nurse Navigator Flowsheets 08/27/2014  Navigator Encounter Type Initial MedOnc  Patient Visit Type Medonc  Treatment Phase Follow Up  Barriers/Navigation Needs Financial  Interventions Coordination of Care--  Coordination of Care MD Appointments--Dr. Deatra Ina  Time Spent with Patient 15  At request of Dr. Benay Spice, called [facility] to request appointment for patient due to abdominal pain. Noted he had an appointment on 7/25 and it was cancelled; as well as colonoscopy for 8/8 that was cancelled. Was told he could be seen today at 1:30 by Dr. Deatra Ina. Patient notified and agreed to appointment today. Social worker already spoke with him today. Will follow up his visit with GI.

## 2014-08-28 ENCOUNTER — Telehealth: Payer: Self-pay | Admitting: *Deleted

## 2014-08-28 LAB — HEPATITIS PANEL, ACUTE
HEP A IGM: NEGATIVE
HEP B S AG: NEGATIVE
Hep B C IgM: NEGATIVE

## 2014-08-28 NOTE — Telephone Encounter (Signed)
Oncology Nurse Navigator Documentation  Oncology Nurse Navigator Flowsheets 08/28/2014  Referral date to RadOnc/MedOnc 08/27/2014  Navigator Encounter Type Introductory phone call  Patient Visit Type -  Treatment Phase -  Barriers/Navigation Needs -  Interventions -  Coordination of Care MD Appointments  Time Spent with Patient -  Provided his brother, Marc Tyler with new patient appointment on 08/29/14 at 2:30/3:00 with Dr. Burr Tyler. Brother is leaving town Saturday going back to [place] and would like to be present at visit.  Patient lives alone and is able to drive. He will bring his meds and insurance information.  Provided our phone number to call if necessary. Called [facility] and they have not received a referral from the hospital physician at this time. Last saw Dr. Penelope Coop in 2007 with last colonoscopy. Dr. Burr Tyler will see him tomorrow first and then make referral. Referral Dx: Metastatic Cancer to Liver

## 2014-08-29 ENCOUNTER — Inpatient Hospital Stay (HOSPITAL_COMMUNITY): Payer: 59

## 2014-08-29 ENCOUNTER — Ambulatory Visit: Payer: 59

## 2014-08-29 ENCOUNTER — Telehealth: Payer: Self-pay | Admitting: Internal Medicine

## 2014-08-29 ENCOUNTER — Encounter: Payer: Self-pay | Admitting: Hematology

## 2014-08-29 ENCOUNTER — Encounter (HOSPITAL_COMMUNITY): Payer: Self-pay

## 2014-08-29 ENCOUNTER — Encounter: Payer: 59 | Admitting: Hematology

## 2014-08-29 ENCOUNTER — Inpatient Hospital Stay (HOSPITAL_COMMUNITY)
Admission: EM | Admit: 2014-08-29 | Discharge: 2014-08-30 | DRG: 441 | Disposition: A | Discharge status: Home Health | Payer: 59 | Attending: Internal Medicine | Admitting: Internal Medicine

## 2014-08-29 DIAGNOSIS — Z7952 Long term (current) use of systemic steroids: Secondary | ICD-10-CM

## 2014-08-29 DIAGNOSIS — E785 Hyperlipidemia, unspecified: Secondary | ICD-10-CM | POA: Diagnosis present

## 2014-08-29 DIAGNOSIS — D638 Anemia in other chronic diseases classified elsewhere: Secondary | ICD-10-CM | POA: Diagnosis present

## 2014-08-29 DIAGNOSIS — I81 Portal vein thrombosis: Secondary | ICD-10-CM | POA: Diagnosis present

## 2014-08-29 DIAGNOSIS — R945 Abnormal results of liver function studies: Secondary | ICD-10-CM

## 2014-08-29 DIAGNOSIS — Z7901 Long term (current) use of anticoagulants: Secondary | ICD-10-CM | POA: Diagnosis not present

## 2014-08-29 DIAGNOSIS — K089 Disorder of teeth and supporting structures, unspecified: Secondary | ICD-10-CM | POA: Diagnosis present

## 2014-08-29 DIAGNOSIS — C787 Secondary malignant neoplasm of liver and intrahepatic bile duct: Secondary | ICD-10-CM | POA: Diagnosis present

## 2014-08-29 DIAGNOSIS — D649 Anemia, unspecified: Secondary | ICD-10-CM | POA: Diagnosis not present

## 2014-08-29 DIAGNOSIS — K088 Other specified disorders of teeth and supporting structures: Secondary | ICD-10-CM | POA: Diagnosis present

## 2014-08-29 DIAGNOSIS — Z87891 Personal history of nicotine dependence: Secondary | ICD-10-CM

## 2014-08-29 DIAGNOSIS — Z79899 Other long term (current) drug therapy: Secondary | ICD-10-CM | POA: Diagnosis not present

## 2014-08-29 DIAGNOSIS — K750 Abscess of liver: Secondary | ICD-10-CM | POA: Diagnosis present

## 2014-08-29 DIAGNOSIS — R7989 Other specified abnormal findings of blood chemistry: Secondary | ICD-10-CM | POA: Diagnosis present

## 2014-08-29 LAB — IRON AND TIBC
Iron: 19 ug/dL — ABNORMAL LOW (ref 45–182)
Saturation Ratios: 6 % — ABNORMAL LOW (ref 17.9–39.5)
TIBC: 312 ug/dL (ref 250–450)
UIBC: 293 ug/dL

## 2014-08-29 LAB — RETICULOCYTES
RBC.: 3.21 MIL/uL — ABNORMAL LOW (ref 4.22–5.81)
Retic Count, Absolute: 89.9 10*3/uL (ref 19.0–186.0)
Retic Ct Pct: 2.8 % (ref 0.4–3.1)

## 2014-08-29 LAB — PROTIME-INR
INR: 1.51 — ABNORMAL HIGH (ref 0.00–1.49)
Prothrombin Time: 18.3 seconds — ABNORMAL HIGH (ref 11.6–15.2)

## 2014-08-29 LAB — FERRITIN: Ferritin: 636 ng/mL — ABNORMAL HIGH (ref 24–336)

## 2014-08-29 LAB — VITAMIN B12: Vitamin B-12: 2237 pg/mL — ABNORMAL HIGH (ref 180–914)

## 2014-08-29 LAB — APTT: APTT: 39 s — AB (ref 24–37)

## 2014-08-29 LAB — FOLATE: Folate: 14.8 ng/mL (ref >5.9)

## 2014-08-29 MED ORDER — METRONIDAZOLE IN NACL 5-0.79 MG/ML-% IV SOLN
500.0000 mg | Freq: Three times a day (TID) | INTRAVENOUS | Status: DC
  Administered 2014-08-30 (×2): 500 mg via INTRAVENOUS
  Filled 2014-08-29 (×4): qty 100

## 2014-08-29 MED ORDER — DEXTROSE 5 % IV SOLN
1.0000 g | Freq: Once | INTRAVENOUS | Status: DC
  Filled 2014-08-29: qty 10

## 2014-08-29 MED ORDER — ACETAMINOPHEN 325 MG PO TABS: 650.0000 mg | ORAL_TABLET | Freq: Four times a day (QID) | ORAL | Status: DC | PRN

## 2014-08-29 MED ORDER — ACETAMINOPHEN 650 MG RE SUPP: 650.0000 mg | Freq: Four times a day (QID) | RECTAL | Status: DC | PRN

## 2014-08-29 MED ORDER — SODIUM CHLORIDE 0.9 % IJ SOLN: 3.0000 mL | INTRAMUSCULAR | Status: DC | PRN

## 2014-08-29 MED ORDER — ADULT MULTIVITAMIN W/MINERALS CH
1.0000 | ORAL_TABLET | Freq: Every day | ORAL | Status: DC
  Administered 2014-08-29 – 2014-08-30 (×2): 1 via ORAL
  Filled 2014-08-29 (×2): qty 1

## 2014-08-29 MED ORDER — OXYCODONE HCL 5 MG PO TABS: 5.0000 mg | ORAL_TABLET | ORAL | Status: DC | PRN

## 2014-08-29 MED ORDER — SODIUM CHLORIDE 0.9 % IV SOLN: 250.0000 mL | INTRAVENOUS | Status: DC | PRN

## 2014-08-29 MED ORDER — IOHEXOL 300 MG/ML  SOLN
100.0000 mL | Freq: Once | INTRAMUSCULAR | Status: AC | PRN
  Administered 2014-08-29: 100 mL via INTRAVENOUS

## 2014-08-29 MED ORDER — DEXTROSE 5 % IV SOLN: 2.0000 g | INTRAVENOUS | Status: DC

## 2014-08-29 MED ORDER — ALPRAZOLAM 0.25 MG PO TABS: 0.2500 mg | ORAL_TABLET | Freq: Three times a day (TID) | ORAL | Status: DC | PRN

## 2014-08-29 MED ORDER — SODIUM CHLORIDE 0.9 % IJ SOLN: 3.0000 mL | Freq: Two times a day (BID) | INTRAMUSCULAR | Status: DC

## 2014-08-29 MED ORDER — FAMOTIDINE 20 MG PO TABS
20.0000 mg | ORAL_TABLET | Freq: Every day | ORAL | Status: DC | PRN
  Filled 2014-08-29: qty 1

## 2014-08-29 MED ORDER — BUPROPION HCL ER (XL) 150 MG PO TB24
150.0000 mg | ORAL_TABLET | Freq: Every day | ORAL | Status: DC
  Filled 2014-08-29: qty 1

## 2014-08-29 MED ORDER — METRONIDAZOLE IN NACL 5-0.79 MG/ML-% IV SOLN
500.0000 mg | Freq: Once | INTRAVENOUS | Status: AC
  Administered 2014-08-29: 500 mg via INTRAVENOUS
  Filled 2014-08-29: qty 100

## 2014-08-29 MED ORDER — VITAMIN C 250 MG PO TABS
250.0000 mg | ORAL_TABLET | Freq: Every day | ORAL | Status: DC
  Administered 2014-08-29 – 2014-08-30 (×2): 250 mg via ORAL
  Filled 2014-08-29 (×2): qty 1

## 2014-08-29 MED ORDER — CARBAMIDE PEROXIDE 6.5 % OT SOLN
5.0000 [drp] | Freq: Two times a day (BID) | OTIC | Status: DC
  Administered 2014-08-29: 5 [drp] via OTIC
  Filled 2014-08-29: qty 15

## 2014-08-29 MED ORDER — ONDANSETRON HCL 4 MG PO TABS
4.0000 mg | ORAL_TABLET | Freq: Four times a day (QID) | ORAL | Status: DC | PRN
Start: 2014-08-29 — End: 2014-08-30

## 2014-08-29 MED ORDER — CEFTRIAXONE SODIUM IN DEXTROSE 40 MG/ML IV SOLN
2.0000 g | INTRAVENOUS | Status: AC
  Administered 2014-08-29: 2 g via INTRAVENOUS
  Filled 2014-08-29: qty 50

## 2014-08-29 MED ORDER — POLYETHYLENE GLYCOL 3350 17 G PO PACK: 17.0000 g | PACK | Freq: Every day | ORAL | Status: DC | PRN

## 2014-08-29 MED ORDER — IOHEXOL 300 MG/ML  SOLN
25.0000 mL | Freq: Once | INTRAMUSCULAR | Status: AC | PRN
  Administered 2014-08-29: 25 mL via ORAL

## 2014-08-29 MED ORDER — ONDANSETRON HCL 4 MG/2ML IJ SOLN: 4.0000 mg | Freq: Four times a day (QID) | INTRAMUSCULAR | Status: DC | PRN

## 2014-08-29 MED ORDER — CEFTRIAXONE SODIUM IN DEXTROSE 40 MG/ML IV SOLN
2.0000 g | INTRAVENOUS | Status: DC
  Administered 2014-08-30: 2 g via INTRAVENOUS
  Filled 2014-08-29: qty 50

## 2014-08-29 MED ORDER — ENSURE ENLIVE PO LIQD
237.0000 mL | Freq: Three times a day (TID) | ORAL | Status: DC
  Administered 2014-08-30: 237 mL via ORAL

## 2014-08-29 MED ORDER — ALUM & MAG HYDROXIDE-SIMETH 200-200-20 MG/5ML PO SUSP: 30.0000 mL | Freq: Four times a day (QID) | ORAL | Status: DC | PRN

## 2014-08-29 NOTE — Progress Notes (Signed)
This encounter was created in error - please disregard.

## 2014-08-29 NOTE — Telephone Encounter (Signed)
Received call from pathologist regarding biopsy which shows histocytes, fibrosis, inflammatory cells consistent with abscess and no evidence of underlying malignancy.  Spoke with Dr. Burr Medico from oncology he was supposed to be seeing the patient in clinic at this time, and ask her to refer the patient to the emergency department. Case was also discussed with Dr. Linus Salmons from infectious disease who recommended admitting the patient, consulted interventional radiology to drain what they can and start the patient on Unasyn.  Dr. Linus Salmons asked to be notified when patient arrives for consultation.

## 2014-08-29 NOTE — ED Provider Notes (Signed)
CSN: [id number]     Arrival date & time 08/29/14  1503 History   First MD Initiated Contact with Patient 08/29/14 1545     Chief Complaint  Patient presents with  . Liver Abscess      (Consider location/radiation/quality/duration/timing/severity/associated sxs/prior Treatment) HPI Comments: 64 y.o. male with past medical history of dyslipidemia, has had colonoscopy about 5 years ago and said it was normal. He says he has been feeling very weak over last 3 weeks and has has some mild crampy abd pain with poor appetite. As a result, he has lost 10 lbs in last 2 weeks. He was seen by his PCP when he started having these issues and was even prescribed abx for possible pneumonia. He was recently admitted after having a CT scan that showed sigmoid colon thickening and a liver lesion concerning for metastatic disease. A biopsy of the liver was done on July 25 during this admission. The results of the biopsy came back today showing probable abscess. Patient was instructed to come back to the emergency department for admission for IV antibiotics and possible IR drainage of the abscess.   Past Medical History  Diagnosis Date  . Hyperlipidemia    Past Surgical History  Procedure Laterality Date  . Hernia repair     History reviewed. No pertinent family history. History  Substance Use Topics  . Smoking status: Former Research scientist (life sciences)  . Smokeless tobacco: Not on file  . Alcohol Use: Yes    Review of Systems  Constitutional: Positive for appetite change, fatigue and unexpected weight change. Negative for fever, chills and diaphoresis.  HENT: Negative for congestion, rhinorrhea and sneezing.   Eyes: Negative.   Respiratory: Negative for cough, chest tightness and shortness of breath.   Cardiovascular: Negative for chest pain and leg swelling.  Gastrointestinal: Negative for nausea, vomiting, abdominal pain, diarrhea and blood in stool.  Genitourinary: Negative for frequency, hematuria, flank pain and  difficulty urinating.  Musculoskeletal: Negative for back pain and arthralgias.  Skin: Negative for rash.  Neurological: Negative for dizziness, speech difficulty, weakness, numbness and headaches.      Allergies  Review of patient's allergies indicates no known allergies.  Home Medications   Prior to Admission medications   Medication Sig Start Date End Date Taking? Authorizing Provider  ALPRAZolam Duanne Moron) 0.25 MG tablet Take 1-2 tablets by mouth 3 (three) times daily as needed for sleep. anxiety 08/19/14  Yes Historical Provider, MD  Ascorbic Acid (VITAMIN C PO) Take 1 tablet by mouth daily.   Yes Historical Provider, MD  buPROPion (WELLBUTRIN XL) 150 MG 24 hr tablet Take 1 tablet by mouth daily. 08/14/14  Yes Historical Provider, MD  dexamethasone (DECADRON) 4 MG tablet Take 1 tablet (4 mg total) by mouth daily. 08/27/14  Yes [name], MD  feeding supplement, ENSURE ENLIVE, (ENSURE ENLIVE) LIQD Take 237 mLs by mouth 2 (two) times daily between meals. Patient taking differently: Take 237 mLs by mouth 3 (three) times daily between meals.  08/27/14  Yes [name], MD  Melatonin 3 MG CAPS Take 1 capsule by mouth at bedtime as needed (sleep).   Yes Historical Provider, MD  Multiple Vitamins-Minerals (MULTIVITAMIN WITH MINERALS) tablet Take 1 tablet by mouth daily.   Yes Historical Provider, MD  omeprazole (PRILOSEC) 20 MG capsule Take 20 mg by mouth daily as needed (indigestion).    Yes Historical Provider, MD  Rivaroxaban (XARELTO STARTER PACK) 15 & 20 MG TBPK Take as directed on package: Start with one 15mg  tablet  by mouth twice a day with food. On Day 22 (August 17th), switch to one 20mg  tablet once a day with food. Patient taking differently: Take 1 tablet (15 mg) by mouth twice a day with food for 21 Days. On Day 22 switch to one tablet (20 mg) by mouth once a day with food. 08/27/14  Yes [name], MD  famotidine (PEPCID) 20 MG tablet Take 20 mg by mouth daily as needed  for heartburn or indigestion.    Historical Provider, MD   BP 147/80 mmHg  Pulse 96  Temp(Src) 98.1 F (36.7 C) (Oral)  Resp 18  SpO2 100% Physical Exam  Constitutional: He is oriented to person, place, and time. He appears well-developed and well-nourished.  HENT:  Head: Normocephalic and atraumatic.  Eyes: Pupils are equal, round, and reactive to light.  Neck: Normal range of motion. Neck supple.  Cardiovascular: Normal rate, regular rhythm and normal heart sounds.   Pulmonary/Chest: Effort normal and breath sounds normal. No respiratory distress. He has no wheezes. He has no rales. He exhibits no tenderness.  Abdominal: Soft. Bowel sounds are normal. There is no tenderness. There is no rebound and no guarding.  Musculoskeletal: Normal range of motion. He exhibits no edema.  Lymphadenopathy:    He has no cervical adenopathy.  Neurological: He is alert and oriented to person, place, and time.  Skin: Skin is warm and dry. No rash noted.  Psychiatric: He has a normal mood and affect.    ED Course  Procedures (including critical care time) Labs Review Labs Reviewed - No data to display  Imaging Review No results found.   EKG Interpretation None      MDM   Final diagnoses:  Liver abscess    Patient presents after biopsy of a liver mass revealed a likely abscess. The recommendations are to start him on Rocephin and Flagyl and admitted to the hospital for IR drainage. I will consult the hospitalist for admission.    Malvin Johns, MD 08/29/14 [id number]

## 2014-08-29 NOTE — Progress Notes (Signed)
Checked in new pt with no financial concerns. Gave patient my card and Shauna's number for any billing questions or concerns or financial assistance.

## 2014-08-29 NOTE — Progress Notes (Signed)
Received a very detailed and concise report from Moss Mc, ED RN at [phone]. Planned to keep pt at ER until labs drawn. Pt is currently at the CT scan and expected presently.

## 2014-08-29 NOTE — Plan of Care (Signed)
Problem: Consults Goal: General Medical Patient Education See Patient Education Module for specific education. Outcome: Completed/Met Date Met:  08/29/14 Discussed with patient need for iv antibiotics over several weeks, PICC line. Pt hopes to be d/c'ed tomorrow

## 2014-08-29 NOTE — ED Notes (Signed)
Pt presents after being told that he has an abscess on his liver.  Pt was recently admitted due to rapid weight loss and r/o liver CA.  Pt had a recent liver biopsy.  Denies pain.  Denies n/v/d.

## 2014-08-29 NOTE — Telephone Encounter (Signed)
Addendum:  Please start patient on ceftriaxone and flagyl.

## 2014-08-29 NOTE — H&P (Addendum)
History and Physical:    Marc Tyler   MLY:[id number] DOB: March 27, 1950 DOA: 08/29/2014  Referring MD/provider: Dr. Tamera Punt PCP: Harle Battiest, MD   Chief Complaint: Liver abscess  History of Present Illness:   Marc Tyler is an 64 y.o. male with a PMH of dyslipidemia, recent hospital admission 08/22/14-08/27/14 for evaluation of abdominal pain and weight loss after he had a CT of the abdomen which showed multiple liver lesions and portal vein thrombosis. This CT was not done here, and there are no images available for review. Findings were initially felt to be due to metastatic cancer of uncertain primary. The patient underwent liver biopsy 08/25/14. After being discharged, the patient's biopsy results resulted as being most consistent with abscess. The patient was notified and advised to return to the hospital for further treatment.  The patient states that he began to have abdominal pain about 3 weeks prior to his first hospitalization, which he describes as a ""knot" in his stomach. He noticed some associated weight loss, but attributed his symptoms to a stressful event involving discord with a neighbor. He has not had any associated fever or chills. He otherwise feels well. Denies any HIV risk factors and has not been sexually active for 20 years.  ROS:   Review of Systems  Constitutional: Positive for weight loss and malaise/fatigue. Negative for fever, chills and diaphoresis.  HENT: Negative.   Eyes: Negative.   Respiratory: Positive for cough. Negative for sputum production and shortness of breath.   Cardiovascular: Negative for chest pain and palpitations.  Gastrointestinal: Negative for nausea, vomiting, abdominal pain, diarrhea, constipation, blood in stool and melena.  Genitourinary: Negative.   Musculoskeletal: Negative.   Skin: Negative.   Neurological: Negative.  Negative for weakness.  Endo/Heme/Allergies: Bruises/bleeds easily.  Psychiatric/Behavioral: The patient is  nervous/anxious.   Travel history: No recent travel.   Past Medical History:   Past Medical History  Diagnosis Date  . Hyperlipidemia     Past Surgical History:   Past Surgical History  Procedure Laterality Date  . Hernia repair    . Portal vein thrombosis      Social History:   History   Social History  . Marital Status: Single    Spouse Name: N/A  . Number of Children: 0  . Years of Education: N/A   Occupational History  . Retired    Social History Main Topics  . Smoking status: Former Research scientist (life sciences)  . Smokeless tobacco: Not on file  . Alcohol Use: No  . Drug Use: No  . Sexual Activity: Not Currently     Comment: No sexual partners for 20 years.   Other Topics Concern  . Not on file   Social History Narrative   Single. Lives alone.      Family history:   History reviewed. No pertinent family history.  Allergies   Review of patient's allergies indicates no known allergies.  Current Medications:   Prior to Admission medications   Medication Sig Start Date End Date Taking? Authorizing Provider  ALPRAZolam Duanne Moron) 0.25 MG tablet Take 1-2 tablets by mouth 3 (three) times daily as needed for sleep. anxiety 08/19/14  Yes Historical Provider, MD  Ascorbic Acid (VITAMIN C PO) Take 1 tablet by mouth daily.   Yes Historical Provider, MD  buPROPion (WELLBUTRIN XL) 150 MG 24 hr tablet Take 1 tablet by mouth daily. 08/14/14  Yes Historical Provider, MD  dexamethasone (DECADRON) 4 MG tablet Take 1 tablet (4 mg total) by mouth daily. 08/27/14  Yes [name], MD  feeding supplement, ENSURE ENLIVE, (ENSURE ENLIVE) LIQD Take 237 mLs by mouth 2 (two) times daily between meals. Patient taking differently: Take 237 mLs by mouth 3 (three) times daily between meals.  08/27/14  Yes [name], MD  Melatonin 3 MG CAPS Take 1 capsule by mouth at bedtime as needed (sleep).   Yes Historical Provider, MD  Multiple Vitamins-Minerals (MULTIVITAMIN WITH MINERALS) tablet Take 1  tablet by mouth daily.   Yes Historical Provider, MD  omeprazole (PRILOSEC) 20 MG capsule Take 20 mg by mouth daily as needed (indigestion).    Yes Historical Provider, MD  Rivaroxaban (XARELTO STARTER PACK) 15 & 20 MG TBPK Take as directed on package: Start with one 15mg  tablet by mouth twice a day with food. On Day 22 (August 17th), switch to one 20mg  tablet once a day with food. Patient taking differently: Take 1 tablet (15 mg) by mouth twice a day with food for 21 Days. On Day 22 switch to one tablet (20 mg) by mouth once a day with food. 08/27/14  Yes [name], MD  famotidine (PEPCID) 20 MG tablet Take 20 mg by mouth daily as needed for heartburn or indigestion.    Historical Provider, MD    Physical Exam:   Filed Vitals:   08/29/14 1515  BP: 147/80  Pulse: 96  Temp: 98.1 F (36.7 C)  TempSrc: Oral  Resp: 18  SpO2: 100%     Physical Exam: Blood pressure 147/80, pulse 96, temperature 98.1 F (36.7 C), temperature source Oral, resp. rate 18, SpO2 100 %. Gen: No acute distress. Head: Normocephalic, atraumatic. Eyes: PERRL, EOMI, sclerae nonicteric. Mouth: Oropharynx reveals poor dentition. Neck: Supple, no thyromegaly, no lymphadenopathy, no jugular venous distention. Chest: Lungs are clear to auscultation bilaterally. CV: Heart sounds are regular. No murmurs, rubs, or gallops. Abdomen: Soft, nontender, nondistended with normal active bowel sounds. Extremities: Extremities are without clubbing, edema, or cyanosis. Skin: Warm and dry. Neuro: Alert and oriented times 3; grossly nonfocal. Psych: Mood and affect normal.   Data Review:    Labs: Basic Metabolic Panel:  Recent Labs Lab 08/23/14 0300 08/25/14 0455 08/26/14 0422 08/27/14 0415  NA 134* 138 139 139  K 4.2 4.1 4.2 3.8  CL 103 105 105 106  CO2 25 25 24 26   GLUCOSE 130* 105* 115* 108*  BUN 14 16 17 11   CREATININE 0.92 1.02 0.88 0.82  CALCIUM 7.9* 8.3* 8.6* 8.4*   Liver Function Tests:  Recent  Labs Lab 08/23/14 0300 08/25/14 0455 08/26/14 0422 08/27/14 0415  AST 46* 46* 44* 28  ALT 57 59 57 43  ALKPHOS 224* 257* 279* 237*  BILITOT 2.7* 1.9* 1.9* 1.6*  PROT 6.1* 6.5 7.0 6.5  ALBUMIN 1.9* 2.0* 2.3* 2.1*   CBC:  Recent Labs Lab 08/23/14 0300 08/24/14 0432 08/25/14 0455 08/26/14 0422 08/27/14 0415  WBC 11.1* 10.8* 8.9 13.2* 9.4  HGB 7.6* 8.6* 8.7* 10.3* 9.3*  HCT 23.0* 26.5* 27.6* 31.4* 28.3*  MCV 94.3 94.3 95.8 97.8 97.9  PLT 366 355 374 444* 394    Assessment/Plan:   Principal Problem:   Liver abscess - Have spoken with ID on call, recommend treatment with 2 g Rocephin daily/Flagyl 500 mg 3 times a day 6 weeks. - Likely will need oral therapy thereafter. - We'll need placement of a PICC line for long-term administration of Rocephin. Flagyl can likely be converted to by mouth. - We'll obtain 2 sets of blood cultures prior to the  initiation of antibiotics. - Source not entirely clear, but suspect may have been from underlying dental infection.  - Will need outpatient follow-up with a dentist. - Spoke with IR who recommends repeat CT scan and possible drainage tomorrow. - Dr. Baxter Flattery will arrange outpatient F/U in ID clinic.  Active Problems:   Portal vein thrombosis - Hold Xarelto for now. Can start IV heparin if I are not able to do abscess drainage in the next 24 hours. - Spoke with IR who recommends holding Xarelto.    Elevated LFTs - Likely from liver abscess.    Normocytic anemia - Likely from anemia of chronic disease/inflammation. - Check anemia panel.    Poor dentition - Will need outpatient follow-up with dental medicine.    DVT prophylaxis - Hold anticoagulation for now in anticipation of liver biopsy.  Code Status: Full. Family Communication: Friend at bedside. Disposition Plan: Home when PICC placed, abscess drained, likely 2 days.  Time spent: 1 hour.  Gwyn Mehring [facility] Pager [phone] Cell: [phone]   If  7PM-7AM, please contact night-coverage [email or web address] Password [email or web address] 08/29/2014, 5:08 PM

## 2014-08-30 MED ORDER — SODIUM CHLORIDE 0.9 % IJ SOLN
10.0000 mL | Freq: Two times a day (BID) | INTRAMUSCULAR | Status: DC
Start: 2014-08-30 — End: 2014-08-30

## 2014-08-30 MED ORDER — SODIUM CHLORIDE 0.9 % IJ SOLN: 10.0000 mL | INTRAMUSCULAR | Status: DC | PRN

## 2014-08-30 MED ORDER — ZOLPIDEM TARTRATE 5 MG PO TABS: 5.0000 mg | ORAL_TABLET | Freq: Every evening | ORAL | Status: DC | PRN

## 2014-08-30 MED ORDER — CEFTRIAXONE SODIUM IN DEXTROSE 40 MG/ML IV SOLN: 2.0000 g | INTRAVENOUS | Status: DC

## 2014-08-30 MED ORDER — CEFTRIAXONE SODIUM IN DEXTROSE 40 MG/ML IV SOLN: 2.0000 g | INTRAVENOUS | Status: AC

## 2014-08-30 MED ORDER — METRONIDAZOLE 500 MG PO TABS: 500.0000 mg | ORAL_TABLET | Freq: Three times a day (TID) | ORAL | Status: AC

## 2014-08-30 NOTE — Care Management Note (Signed)
Case Management Note  Patient Details  Name: Marc Tyler MRN: [id number] Date of Birth: 1951/01/09  Subjective/Objective:     Liver lesions              Action/Plan: Home Health  Expected Discharge Date:  08/30/2014               Expected Discharge Plan:  [place]  In-House Referral:     Discharge planning Services  CM Consult  Post Acute Care Choice:  Home Health Choice offered to:  Patient  DME Arranged:    DME Agency:  [place] Arranged:  RN, IV Antibiotics HH Agency:  [place]  Status of Service:  Completed, signed off  Medicare Important Message Given:    Date Medicare IM Given:    Medicare IM give by:    Date Additional Medicare IM Given:    Additional Medicare Important Message give by:     If discussed at [place] of Stay Meetings, dates discussed:    Additional Comments: NCM spoke to pt and offered choice for [facility]. Pt agreeable to [facility] for [facility] RN for IV abx. Contacted [facility] with new referral. Faxed IV abx RX to [facility] pharmacy. Called to confirm receipt. Rx received, will schedule soc for 08/31/2014.  Erenest Rasher, RN 08/30/2014, 2:46 PM

## 2014-08-30 NOTE — Progress Notes (Signed)
Patient discharged home, all discharge medications and instructions reviewed and questions answered. Patient to be assisted to vehicle by wheelchair when ride arrives. 

## 2014-08-30 NOTE — Progress Notes (Signed)
Peripherally Inserted Central Catheter/Midline Placement  The IV Nurse has discussed with the patient and/or persons authorized to consent for the patient, the purpose of this procedure and the potential benefits and risks involved with this procedure.  The benefits include less needle sticks, lab draws from the catheter and patient may be discharged home with the catheter.  Risks include, but not limited to, infection, bleeding, blood clot (thrombus formation), and puncture of an artery; nerve damage and irregular heat beat.  Alternatives to this procedure were also discussed.  PICC/Midline Placement Documentation  PICC / Midline Single Lumen 94/32/76 PICC Right Basilic 38 cm 0 cm (Active)  Indication for Insertion or Continuance of Line Home intravenous therapies (PICC only) 08/30/2014  9:10 AM  Exposed Catheter (cm) 0 cm 08/30/2014  9:10 AM  Site Assessment Clean;Dry;Intact 08/30/2014  9:10 AM  Line Status Flushed;Saline locked;Blood return noted 08/30/2014  9:10 AM  Dressing Type Transparent 08/30/2014  9:10 AM  Dressing Status Clean;Dry;Intact;Antimicrobial disc in place 08/30/2014  9:10 AM  Line Care Connections checked and tightened 08/30/2014  9:10 AM  Line Adjustment (NICU/IV Team Only) No 08/30/2014  9:10 AM  Dressing Intervention New dressing 08/30/2014  9:10 AM  Dressing Change Due 09/06/14 08/30/2014  9:10 AM       Rolena Infante 08/30/2014, 9:11 AM

## 2014-08-30 NOTE — Discharge Summary (Signed)
Physician Discharge Summary  Marc Tyler SEG:[id number] DOB: [birth year] DOA: 08/29/2014  PCP: Harle Battiest, MD  Admit date: 08/29/2014 Discharge date: 08/30/2014   Recommendations for Outpatient Follow-Up:   1. Recommend close follow-up with PCP and GI/ID consultants for further assessment of liver abnormality. 2. PCP/ID please follow-up on final blood culture results.   Discharge Diagnosis:   Principal Problem:    Liver lesions, abscess versus metastasis Active Problems:    Portal vein thrombosis    Elevated LFTs    Normocytic anemia    Poor dentition   Discharge disposition:  Home.    Discharge Condition: Stable.  Diet recommendation: Regular.    History of Present Illness:   Marc Tyler is an 64 y.o. male with a PMH of dyslipidemia, recent hospital admission 08/22/14-08/27/14 for evaluation of abdominal pain and weight loss after he had a CT of the abdomen which showed multiple liver lesions and portal vein thrombosis. This CT was not done here, and there are no images available for review. Findings were initially felt to be due to metastatic cancer of uncertain primary. The patient underwent liver biopsy 08/25/14. After being discharged, the patient's biopsy results resulted as being most consistent with abscess. The patient was notified and advised to return to the hospital for further treatment.  Hospital Course by Problem:   Principal Problem:  Liver abscess - ID initially recommended 2 g Rocephin daily/Flagyl 500 mg 3 times a day 6 weeks. - Likely will need oral therapy thereafter. Dr. Baxter Flattery will arrange for post hospitalization follow-up. - PICC line placed. Flagyl can likely be converted to by mouth, but will need 6 weeks of Rocephin. - Follow-up blood cultures. - Source not entirely clear, but suspect may have been from underlying dental infection.  - The patient says he has had a dental exam in the last 2 months. - Repeat CT scan done and did not show  discrete abscesses, but rather heterogeneous appearing liver. - Biopsy showed findings consistent with an infectious/inflammatory process. - At this point, the diagnosis is not entirely clear and he will need close follow-up with possible repeat imaging/biopsy for definitive diagnosis. The interventional radiologist recommends outpatient colonoscopy given the findings of diverticular disease. - We'll discharge home on IV antibiotics until the patient follows up with GI/ID for further recommendations as cholangitis was mentioned in the differential by the pathologist. - Offered to have this worked up further as an inpatient, however the patient prefers to be discharged home to follow-up with his PCP to arrange outpatient GI follow-up/colonoscopy.  Active Problems:  Portal vein thrombosis - Resume Xarelto at discharge.   Elevated LFTs - Likely from liver pathology.   Normocytic anemia - Likely from anemia of chronic disease/inflammation. - No B-12/folate deficiency. Ferritin high. Findings consistent with anemia of chronic disease/inflammation.   Poor dentition - Will need outpatient follow-up with dental medicine.  Medical Consultants:    Interventional Radiology  Telephone consultation with ID   Discharge Exam:   Filed Vitals:   08/30/14 0552  BP: 135/78  Pulse: 81  Temp: 98.4 F (36.9 C)  Resp: 18   Filed Vitals:   08/29/14 1515 08/29/14 1850 08/29/14 2227 08/30/14 0552  BP: 147/80 151/85 123/71 135/78  Pulse: 96 81 80 81  Temp: 98.1 F (36.7 C) 97.7 F (36.5 C) 98.3 F (36.8 C) 98.4 F (36.9 C)  TempSrc: Oral Oral Oral Oral  Resp: 18 18 18 18   Height:  5\' 10"  (1.778 m)    Weight:  73.573  kg (162 lb 3.2 oz)    SpO2: 100% 100% 99% 99%    Gen:  NAD Cardiovascular:  RRR, No M/R/G Respiratory: Lungs CTAB Gastrointestinal: Abdomen soft, NT/ND with normal active bowel sounds. Extremities: No C/E/C   The results of significant diagnostics from this  hospitalization (including imaging, microbiology, ancillary and laboratory) are listed below for reference.     Procedures and Diagnostic Studies:   Ct Abdomen W Contrast  08/29/2014   CLINICAL DATA:  Abdominal pain, weight loss. Prior outside CT demonstrated liver lesions. Recent biopsy showed abscess.  EXAM: CT ABDOMEN WITH CONTRAST  TECHNIQUE: Multidetector CT imaging of the abdomen was performed using the standard protocol following bolus administration of intravenous contrast.  CONTRAST:  31mL OMNIPAQUE IOHEXOL 300 MG/ML SOLN, 191mL OMNIPAQUE IOHEXOL 300 MG/ML SOLN  COMPARISON:  Ultrasound biopsy 08/25/2014  FINDINGS: Lung bases are clear.  No effusions.  Heart is normal size.  Abnormal heterogeneous appearance throughout much of the right hepatic lobe peripherally. Focal low-density areas are noted peripherally in the mid to lower right hepatic lobe. This is likely the area previously biopsied. Recommend correlation with biopsy results. Thrombosis of the left portal vein and anterior branches of the right portal vein. Main portal vein remains patent.  Spleen, gallbladder, pancreas, adrenals and kidneys are unremarkable. Descending colonic and sigmoid diverticulosis. No active diverticulitis. Stomach and small bowel are unremarkable. Aorta and iliac vessels are non aneurysmal with scattered calcifications. No free fluid, free air or adenopathy.  There is a calcification in the left side of the pelvis which appears to be at the left ureterovesical junction measuring 10 mm. No associated hydronephrosis. Urinary bladder is unremarkable.  Small left inguinal hernia containing fat. No acute bony abnormality or focal bone lesion.  IMPRESSION: Heterogeneous appearance throughout much of the right hepatic lobe. There are focal low-density areas inferiorly within the peripheral right hepatic lobe corresponding to the areas biopsied previously. Recommend correlation with prior biopsy results. Comparison to outside  CT would be beneficial to assess for change.  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  Left colonic diverticulosis.  10 mm calcification appears to be with in the left distal ureter at the UVJ. No evidence of hydronephrosis/obstruction.   Electronically Signed   By: Rolm Baptise M.D.   On: 08/29/2014 18:44     Labs:   Basic Metabolic Panel:  Recent Labs Lab 08/25/14 0455 08/26/14 0422 08/27/14 0415  NA 138 139 139  K 4.1 4.2 3.8  CL 105 105 106  CO2 25 24 26   GLUCOSE 105* 115* 108*  BUN 16 17 11   CREATININE 1.02 0.88 0.82  CALCIUM 8.3* 8.6* 8.4*   GFR Estimated Creatinine Clearance: 96.4 mL/min (by C-G formula based on Cr of 0.82). Liver Function Tests:  Recent Labs Lab 08/25/14 0455 08/26/14 0422 08/27/14 0415  AST 46* 44* 28  ALT 59 57 43  ALKPHOS 257* 279* 237*  BILITOT 1.9* 1.9* 1.6*  PROT 6.5 7.0 6.5  ALBUMIN 2.0* 2.3* 2.1*   Coagulation profile  Recent Labs Lab 08/25/14 0455 08/29/14 1824  INR 1.12 1.51*    CBC:  Recent Labs Lab 08/24/14 0432 08/25/14 0455 08/26/14 0422 08/27/14 0415  WBC 10.8* 8.9 13.2* 9.4  HGB 8.6* 8.7* 10.3* 9.3*  HCT 26.5* 27.6* 31.4* 28.3*  MCV 94.3 95.8 97.8 97.9  PLT 355 374 444* 394   Anemia work up  Recent Labs  08/29/14 1824  VITAMINB12 2237*  FOLATE 14.8  FERRITIN  636*  TIBC 312  IRON 19*  RETICCTPCT 2.8   Microbiology Recent Results (from the past 240 hour(s))  Culture, blood (routine x 2)     Status: None (Preliminary result)   Collection Time: 08/29/14  5:54 PM  Result Value Ref Range Status   Specimen Description BLOOD LEFT WRIST  Final   Special Requests BOTTLES DRAWN AEROBIC AND ANAEROBIC 5CC  Final   Culture   Final    NO GROWTH < 24 HOURS Performed at [facility]    Report Status PENDING  Incomplete  Culture, blood (routine x 2)     Status: None (Preliminary result)   Collection Time: 08/29/14  6:10 PM  Result Value Ref Range Status   Specimen  Description BLOOD RIGHT HAND  Final   Special Requests BOTTLES DRAWN AEROBIC AND ANAEROBIC 5CC  Final   Culture   Final    NO GROWTH < 24 HOURS Performed at [facility]    Report Status PENDING  Incomplete     Discharge Instructions:   Discharge Instructions    Call MD for:  extreme fatigue    Complete by:  As directed      Call MD for:  persistant nausea and vomiting    Complete by:  As directed      Call MD for:  severe uncontrolled pain    Complete by:  As directed      Call MD for:  temperature >100.4    Complete by:  As directed      Call MD for:    Complete by:  As directed   Ongoing weight loss.     Diet general    Complete by:  As directed      Discharge instructions    Complete by:  As directed   Blood clots in the liver can either be caused by cancer or infection. The cause, at this point, is suspected to be infection but IS NOT confirmed conclusively.  We strongly recommend you have your PCP refer you to a GI specialist to have a colonoscopy.  We will arrange for you to follow up with an ID specialist, who will call you with an appt time.     Increase activity slowly    Complete by:  As directed             Medication List    STOP taking these medications        dexamethasone 4 MG tablet  Commonly known as:  DECADRON      TAKE these medications        ALPRAZolam 0.25 MG tablet  Commonly known as:  XANAX  Take 1-2 tablets by mouth 3 (three) times daily as needed for sleep. anxiety     buPROPion 150 MG 24 hr tablet  Commonly known as:  WELLBUTRIN XL  Take 1 tablet by mouth daily.     cefTRIAXone 40 MG/ML IVPB  Commonly known as:  ROCEPHIN  Inject 50 mLs (2 g total) into the vein daily.     famotidine 20 MG tablet  Commonly known as:  PEPCID  Take 20 mg by mouth daily as needed for heartburn or indigestion.     feeding supplement (ENSURE ENLIVE) Liqd  Take 237 mLs by mouth 2 (two) times daily between meals.     Melatonin 3 MG Caps  Take  1 capsule by mouth at bedtime as needed (sleep).     metroNIDAZOLE 500 MG tablet  Commonly known  as:  FLAGYL  Take 1 tablet (500 mg total) by mouth 3 (three) times daily.     multivitamin with minerals tablet  Take 1 tablet by mouth daily.     omeprazole 20 MG capsule  Commonly known as:  PRILOSEC  Take 20 mg by mouth daily as needed (indigestion).     Rivaroxaban 15 & 20 MG Tbpk  Commonly known as:  XARELTO STARTER PACK  Take as directed on package: Start with one 15mg  tablet by mouth twice a day with food. On Day 22 (August 17th), switch to one 20mg  tablet once a day with food.     VITAMIN C PO  Take 1 tablet by mouth daily.           Follow-up Information    Follow up with [facility], MD. Schedule an appointment as soon as possible for a visit in 2 days.   Specialty:  Obstetrics and Gynecology   Why:  To arrange for GI consultation/colonoscopy.   Contact information:   [place] [id number] [phone]       Follow up with [facility]             .   Why:  Office will call with an appt time.   Contact information:   [address] [place] [address]        Time coordinating discharge: 35 minutes.   Signed:  RAMA,CHRISTINA  Pager [phone] [facility] 08/30/2014, 1:19 PM

## 2014-08-30 NOTE — Discharge Instructions (Signed)
Liver Abscess A liver abscess is a sore inside your liver that contains a thick fluid (pus). Pus in the liver is usually caused by either a bacterial infection or infection with a parasite called an amoeba. CAUSES The liver can get infected:  If an infection somewhere in the abdomen spreads to the liver. This spread of infection could occur from an infected appendix, from an infection in the large intestine (diverticulitis), or from an infection in the gallbladder (cholecystitis) or bile ducts (cholangitis). Bile ducts are the tubes that drain bile out of the liver. Bile is a fluid produced by the liver that helps you digest some foods.  After surgery to the abdomen or after a penetrating abdominal injury. This could occur due to an accident or violence, such as a gunshot or stabbing.  If an infection from a different part of the body spreads to the liver. These infections usually spread to the liver through your bloodstream. A liver abscess can contain:  Bacteria. This is the most common cause, and usually more than one type of bacteria is involved. The bacteria that typically cause the infection are those that normally occupy the inside of your intestines where they cause no harm or illness.  Entamoeba histolytica. This is a tiny parasite called an amoeba.When this parasite is the cause, the disease is called an amebic liver abscess.  Candida. This is a fungus, and it is a rare cause. When this is the cause, there are usually multiple liver abscesses occurring in a person who isvery ill because of an underlying disease such as leukemia or other cancers, or in a person who is receiving nourishment by vein over long periods of time because of other conditions. SYMPTOMS Signs that you might have a liver abscess often come on slowly over many days or a few weeks. The most typical symptoms are fever and pain over or deep within the liver. The liver is in the upper right part of the abdomen, and the  pain may also go up into your right shoulder. Other common symptoms may include:  Malaise. This means you feel uncomfortable. You may feel like you are coming down with something, but you are not yet sick.  Loss of appetite.  Weight loss.  Throwing up (vomiting) or feeling sick to your stomach (nauseous). DIAGNOSIS  To determine whether you have a liver abscess, your caregiver will probably use some of the following methods.  Physical exam. This will include asking about symptoms. Your caregiver may also press on the liver to check for pain.  Blood tests. These tests can tell whether the liver is working like it should. Tests can also check for bacteria or a fungus in the blood, and determine if the infection may be an amebic abscess. They can also check if you have a high number of white blood cells. This is a sign of infection.  Imaging tests, such as CT scans or ultrasounds. These tests take pictures of the liver. This is often the best way to find an abscess.  Needle aspiration. A long needle is put through the skin and into the liver. A CT scan may be used to help find the abscess. Once inside the abscess, a sample of pus is drained out through the needle. The sample is studied under a microscope and a culture is performed. This will show what is causing the abscess. TREATMENT A liver abscess must be treated. Treatment of liver abscesses caused by bacteria involves draining the pus and  taking antibiotics to fight infection. Treatment may take awhile. You might be in a hospital for 2 weeks or more. Some treatments will continue once you are at home.  Antibiotics. You will start taking these medicines as soon as possible. Often, 2 or more antibiotics are needed. Which drugs you take will depend on what bacteria are causing your abscess.  In the hospital, you may be given antibiotics through an intravenous line (IV). A needle will be put in your hand or arm. It is hooked to a plastic tube.  The medicine will flow directly into your body through the IV.  At home, you will probably still need to take antibiotics either by IV or in pill form.You may need to do this for 4 to 6 weeks or longer.  Drainage of a liver abscess caused by bacteria is a necessary part of treatment. Methods include:  Drainage through the skin (percutaneous). Most of the time, a thin tube (catheter) is put in place when needle aspiration is done. This allows the abscess to keep draining. Your caregiver may also use the catheter to flush out the abscess area. When the pus is gone, the catheter will be taken out.  Open drainage. This involves a surgical procedure.It may be needed for very large abscesses. It may also be done if other treatments are not working. Liver abscesses caused by either amoeba or candida do not usually need drainage. However, effective treatment of these abscesses does require many weeks of therapy with a drug or drugs to cure these infections. HOME CARE INSTRUCTIONS   Only take over-the-counter or prescription medicines as directed by your caregiver.  Return for new blood tests and imaging tests as directed by your caregiver.  Keep all follow-up appointments. Your caregiver will need to check whether you are getting better.  Take it easy for awhile. Avoid heavy lifting. Do not do anything that takes extra effort until your caregiver says it is okay. Ask when you will be able to go back to your normal activities. SEEK MEDICAL CARE IF:  You have any questions about medicines.  You do not feel like eating.  You feel nauseous.  You have pain in your abdomen. SEEK IMMEDIATE MEDICAL CARE IF:  Pain in your abdomen suddenly gets worse.  Your skin or eyes look yellow.  You feel like sleeping all the time.  You develop confusion.  You have a fever. Document Released: 05/04/2010 Document Revised: 04/11/2011 Document Reviewed: 05/04/2010 [facility] Patient Information 2015  [place], [place]. This information is not intended to replace advice given to you by your health care provider. Make sure you discuss any questions you have with your health care provider.

## 2014-08-30 NOTE — Progress Notes (Signed)
Patient ID: Marc Tyler, male   DOB: 1950-02-15, 64 y.o.   MRN: [id number] Request received for possible CT guided drainage of ?hepatic abscess in pt. All imaging studies, including outside CT reviewed by Dr. Earleen Newport today as well as labs, clinical status. Based on his review there is no hepatic abscess noted on latest CT or anything to target for obtaining cultures. Pt would benefit from colonoscopy to further eval diverticular dz. Please page him at [phone] with any additional questions. Outside CT was given back to pt today.

## 2014-08-31 NOTE — Care Management (Signed)
CM received a call from Raoul Pitch at [facility]. Barnett Applebaum requested the patient's diagnosis, IV access and stop date for IV antibiotics. Provided information from the discharge summary as follows:Liver Abscess, IV antibiotics for 6 weeks and patient has a PICC line. Presenter, broadcasting BSN CCM

## 2014-09-03 LAB — CULTURE, BLOOD (ROUTINE X 2)
CULTURE: NO GROWTH
Culture: NO GROWTH

## 2014-09-04 ENCOUNTER — Telehealth: Payer: Self-pay | Admitting: Licensed Clinical Social Worker

## 2014-09-04 ENCOUNTER — Emergency Department (HOSPITAL_COMMUNITY)
Admission: EM | Admit: 2014-09-04 | Discharge: 2014-09-04 | Disposition: A | Discharge status: Home / Self Care | Payer: 59 | Attending: Emergency Medicine | Admitting: Emergency Medicine

## 2014-09-04 ENCOUNTER — Encounter (HOSPITAL_COMMUNITY): Payer: Self-pay

## 2014-09-04 DIAGNOSIS — Z79899 Other long term (current) drug therapy: Secondary | ICD-10-CM | POA: Insufficient documentation

## 2014-09-04 DIAGNOSIS — R899 Unspecified abnormal finding in specimens from other organs, systems and tissues: Secondary | ICD-10-CM

## 2014-09-04 DIAGNOSIS — Z7901 Long term (current) use of anticoagulants: Secondary | ICD-10-CM | POA: Diagnosis not present

## 2014-09-04 DIAGNOSIS — R7989 Other specified abnormal findings of blood chemistry: Secondary | ICD-10-CM | POA: Insufficient documentation

## 2014-09-04 DIAGNOSIS — Z8639 Personal history of other endocrine, nutritional and metabolic disease: Secondary | ICD-10-CM | POA: Insufficient documentation

## 2014-09-04 LAB — CBC WITH DIFFERENTIAL/PLATELET
Basophils Absolute: 0 10*3/uL (ref 0.0–0.1)
Basophils Relative: 0 % (ref 0–1)
EOS ABS: 0 10*3/uL (ref 0.0–0.7)
Eosinophils Relative: 1 % (ref 0–5)
HCT: 30 % — ABNORMAL LOW (ref 39.0–52.0)
HEMOGLOBIN: 9.3 g/dL — AB (ref 13.0–17.0)
LYMPHS ABS: 1.2 10*3/uL (ref 0.7–4.0)
LYMPHS PCT: 14 % (ref 12–46)
MCH: 30.1 pg (ref 26.0–34.0)
MCHC: 31 g/dL (ref 30.0–36.0)
MCV: 97.1 fL (ref 78.0–100.0)
Monocytes Absolute: 0.5 10*3/uL (ref 0.1–1.0)
Monocytes Relative: 6 % (ref 3–12)
NEUTROS ABS: 6.7 10*3/uL (ref 1.7–7.7)
NEUTROS PCT: 79 % — AB (ref 43–77)
Platelets: 272 10*3/uL (ref 150–400)
RBC: 3.09 MIL/uL — ABNORMAL LOW (ref 4.22–5.81)
RDW: 14.9 % (ref 11.5–15.5)
WBC: 8.4 10*3/uL (ref 4.0–10.5)

## 2014-09-04 LAB — BASIC METABOLIC PANEL
Anion gap: 8 (ref 5–15)
BUN: 16 mg/dL (ref 6–20)
CHLORIDE: 106 mmol/L (ref 101–111)
CO2: 23 mmol/L (ref 22–32)
CREATININE: 0.94 mg/dL (ref 0.61–1.24)
Calcium: 8.4 mg/dL — ABNORMAL LOW (ref 8.9–10.3)
GFR calc Af Amer: >60 mL/min (ref >60)
Glucose, Bld: 215 mg/dL — ABNORMAL HIGH (ref 65–99)
POTASSIUM: 4 mmol/L (ref 3.5–5.1)
SODIUM: 137 mmol/L (ref 135–145)

## 2014-09-04 NOTE — Discharge Instructions (Signed)
Hyperkalemia Hyperkalemia is when you have too much potassium in your blood. This can be a life-threatening condition. Potassium is normally removed (excreted) from the body by the kidneys. CAUSES  The potassium level in your body can become too high for the following reasons:  You take in too much potassium. You can do this by:  Using salt substitutes. They contain large amounts of potassium.  Taking potassium supplements from your caregiver. The dose may be too high for you.  Eating foods or taking nutritional products with potassium.  You excrete too little potassium. This can happen if:  Your kidneys are not functioning properly. Kidney (renal) disease is a very common cause of hyperkalemia.  You are taking medicines that lower your excretion of potassium, such as certain diuretic medicines.  You have an adrenal gland disease called Addison's disease.  You have a urinary tract obstruction, such as kidney stones.  You are on treatment to mechanically clean your blood (dialysis) and you skip a treatment.  You release a high amount of potassium from your cells into your blood. You may have a condition that causes potassium to move from your cells to your bloodstream. This can happen with:  Injury to muscles or other tissues. Most potassium is stored in the muscles.  Severe burns or infections.  Acidic blood plasma (acidosis). Acidosis can result from many diseases, such as uncontrolled diabetes. SYMPTOMS  Usually, there are no symptoms unless the potassium is dangerously high or has risen very quickly. Symptoms may include:  Irregular or very slow heartbeat.  Feeling sick to your stomach (nauseous).  Tiredness (fatigue).  Nerve problems such as tingling of the skin, numbness of the hands or feet, weakness, or paralysis. DIAGNOSIS  A simple blood test can measure the amount of potassium in your body. An electrocardiogram test of the heart can also help make the diagnosis.  The heart may beat dangerously fast or slow down and stop beating with severe hyperkalemia.  TREATMENT  Treatment depends on how bad the condition is and on the underlying cause.  If the hyperkalemia is an emergency (causing heart problems or paralysis), many different medicines can be used alone or together to lower the potassium level briefly. This may include an insulin injection even if you are not diabetic. Emergency dialysis may be needed to remove potassium from the body.  If the hyperkalemia is less severe or dangerous, the underlying cause is treated. This can include taking medicines if needed. Your prescription medicines may be changed. You may also need to take a medicine to help your body get rid of potassium. You may need to eat a diet low in potassium. HOME CARE INSTRUCTIONS   Take medicines and supplements as directed by your caregiver.  Do not take any over-the-counter medicines, supplements, natural products, herbs, or vitamins without reviewing them with your caregiver. Certain supplements and natural food products can have high amounts of potassium. Other products (such as ibuprofen) can damage weak kidneys and raise your potassium.  You may be asked to do repeat lab tests. Be sure to follow these directions.  If you have kidney disease, you may need to follow a low potassium diet. SEEK MEDICAL CARE IF:   You notice an irregular or very slow heartbeat.  You feel lightheaded.  You develop weakness that is unusual for you. SEEK IMMEDIATE MEDICAL CARE IF:   You have shortness of breath.  You have chest discomfort.  You pass out (faint). MAKE SURE YOU:   Understand   these instructions.  Will watch your condition.  Will get help right away if you are not doing well or get worse. Document Released: 01/07/2002 Document Revised: 04/11/2011 Document Reviewed: 04/24/2013 [employer] Patient Information 2015 [employer]. This information is not intended to replace  advice given to you by your health care provider. Make sure you discuss any questions you have with your health care provider.  

## 2014-09-04 NOTE — ED Provider Notes (Signed)
CSN: [id number]     Arrival date & time 09/04/14  0957 History   First MD Initiated Contact with Patient 09/04/14 1013     Chief Complaint  Patient presents with  . Abnormal Lab     (Consider location/radiation/quality/duration/timing/severity/associated sxs/prior Treatment) HPI 65 y.o. male presents with asymptomatic questionable hyperkalemia. He was called for labs drawn 2 days ago. The patient was recently seen for multiple liver abscesses and sent home with a PICC line with instructions to take IV antibiotics. He has been fatigued, but otherwise denies nausea, vomiting, diarrhea, chest pain, shortness of breath, or other symptoms. Nothing exacerbates any symptoms. He has no abdominal pain or tenderness. Past Medical History  Diagnosis Date  . Hyperlipidemia    Past Surgical History  Procedure Laterality Date  . Hernia repair    . Portal vein thrombosis     History reviewed. No pertinent family history. History  Substance Use Topics  . Smoking status: Former Research scientist (life sciences)  . Smokeless tobacco: Not on file  . Alcohol Use: No    Review of Systems  All other systems reviewed and are negative.     Allergies  Review of patient's allergies indicates no known allergies.  Home Medications   Prior to Admission medications   Medication Sig Start Date End Date Taking? Authorizing Provider  ALPRAZolam Duanne Moron) 0.25 MG tablet Take 1-2 tablets by mouth 3 (three) times daily as needed for sleep. anxiety 08/19/14  Yes Historical Provider, MD  Ascorbic Acid (VITAMIN C PO) Take 1 tablet by mouth daily.   Yes Historical Provider, MD  cefTRIAXone (ROCEPHIN) 40 MG/ML IVPB Inject 50 mLs (2 g total) into the vein daily. 08/30/14 10/12/14 Yes Christina P Rama, MD  famotidine (PEPCID) 20 MG tablet Take 20 mg by mouth daily as needed for heartburn or indigestion.   Yes Historical Provider, MD  feeding supplement, ENSURE ENLIVE, (ENSURE ENLIVE) LIQD Take 237 mLs by mouth 2 (two) times daily between  meals. Patient taking differently: Take 237 mLs by mouth 3 (three) times daily between meals.  08/27/14  Yes [name], MD  Melatonin 3 MG CAPS Take 3 mg by mouth at bedtime as needed (sleep).    Yes Historical Provider, MD  metroNIDAZOLE (FLAGYL) 500 MG tablet Take 1 tablet (500 mg total) by mouth 3 (three) times daily. 08/30/14 10/12/14 Yes Christina P Rama, MD  Multiple Vitamins-Minerals (MULTIVITAMIN WITH MINERALS) tablet Take 1 tablet by mouth daily.   Yes Historical Provider, MD  omeprazole (PRILOSEC) 20 MG capsule Take 20 mg by mouth daily as needed (indigestion).    Yes Historical Provider, MD  Rivaroxaban (XARELTO STARTER PACK) 15 & 20 MG TBPK Take as directed on package: Start with one 15mg  tablet by mouth twice a day with food. On Day 22 (August 17th), switch to one 20mg  tablet once a day with food. Patient taking differently: Take 1 tablet (15 mg) by mouth twice a day with food for 21 Days. On Day 22 switch to one tablet (20 mg) by mouth once a day with food. 08/27/14  Yes [name], MD   BP 137/86 mmHg  Pulse 99  Temp(Src) 97.7 F (36.5 C) (Oral)  Resp 18  SpO2 99% Physical Exam  Constitutional: He is oriented to person, place, and time. He appears well-developed and well-nourished.  HENT:  Head: Normocephalic and atraumatic.  Eyes: Conjunctivae and EOM are normal.  Neck: Normal range of motion. Neck supple.  Cardiovascular: Normal rate, regular rhythm and normal heart sounds.  Pulmonary/Chest: Effort normal and breath sounds normal. No respiratory distress.  Abdominal: He exhibits no distension. There is no tenderness. There is no rebound and no guarding.  Musculoskeletal: Normal range of motion.  Neurological: He is alert and oriented to person, place, and time.  Skin: Skin is warm and dry.  Vitals reviewed.   ED Course  Procedures (including critical care time) Labs Review Labs Reviewed  CBC WITH DIFFERENTIAL/PLATELET - Abnormal; Notable for the following:     RBC 3.09 (*)    Hemoglobin 9.3 (*)    HCT 30.0 (*)    Neutrophils Relative % 79 (*)    All other components within normal limits  BASIC METABOLIC PANEL - Abnormal; Notable for the following:    Glucose, Bld 215 (*)    Calcium 8.4 (*)    All other components within normal limits    Imaging Review No results found.   EKG Interpretation   Date/Time:  Thursday September 04 2014 10:25:00 EDT Ventricular Rate:  93 PR Interval:  137 QRS Duration: 96 QT Interval:  423 QTC Calculation: 526 R Axis:   5 Text Interpretation:  Sinus rhythm Nonspecific T abnrm, anterolateral  leads Prolonged QT interval No significant change since last tracing  Confirmed by Debby Freiberg [phone]) on 09/04/2014 10:30:35 AM      MDM   Final diagnoses:  Abnormal laboratory test    64 y.o. male with pertinent PMH of recently dx liver abscesses on home PICC abx presents with questionable hyperkalemia. He does not know what the number was. He is asymptomatic on my exam. Physical exam benign.    Wu with normal labs.  Likely hemolyzed sample from home.  DC home in stable condition.    I have reviewed all laboratory and imaging studies if ordered as above  1. Abnormal laboratory test          Debby Freiberg, MD 09/05/14 [phone]

## 2014-09-04 NOTE — ED Notes (Signed)
Pt directed to come to ED after being told his potassium is elevated.  Pt had blood drawn on Tuesday and was called this morning.  Pt unsure of value.  Pt is currently self-administering home antibiotics, due to liver abscesses.  Pt has no complaints.

## 2014-09-04 NOTE — Telephone Encounter (Signed)
Heather RN called in critical labs for potassium greater than 7.5 no hemolysis. Patient has not been seen at our clinic before but was seen by ID in the hospital. I gave the RN Dr. Storm Frisk pager number to get orders.

## 2014-09-18 ENCOUNTER — Other Ambulatory Visit: Payer: Self-pay | Admitting: *Deleted

## 2014-09-25 ENCOUNTER — Ambulatory Visit (INDEPENDENT_AMBULATORY_CARE_PROVIDER_SITE_OTHER): Payer: 59 | Admitting: Internal Medicine

## 2014-09-25 ENCOUNTER — Telehealth: Payer: Self-pay | Admitting: *Deleted

## 2014-09-25 ENCOUNTER — Encounter: Payer: Self-pay | Admitting: Internal Medicine

## 2014-09-25 VITALS — BP 128/85 | HR 63 | Temp 98.1°F | Ht 70.0 in | Wt 165.0 lb

## 2014-09-25 DIAGNOSIS — K750 Abscess of liver: Secondary | ICD-10-CM | POA: Diagnosis not present

## 2014-09-25 NOTE — Progress Notes (Signed)
Patient ID: Marc Tyler, male   DOB: [birth year], 64 y.o.   MRN: [id number]       Patient ID: Marc Tyler, male   DOB: 22-Apr-1950, 64 y.o.   MRN: [id number]  HPI 64yo  M with Hospitalized 1 month ago for liver abscess discharged on ceftriaxone 2gm IV daily plus metronidazole 500mg  po TID. It was unclear source of lesion, he is thankful that it was not malignancy. Doing well with antibiotics.during hospitalization, he was found to have portal vein thrombosis, which is not unusual in setting of infection  Outpatient Encounter Prescriptions as of 09/25/2014  Medication Sig  . ALPRAZolam (XANAX) 0.25 MG tablet Take 1-2 tablets by mouth 3 (three) times daily as needed for sleep. anxiety  . Ascorbic Acid (VITAMIN C PO) Take 1 tablet by mouth daily.  . cefTRIAXone (ROCEPHIN) 40 MG/ML IVPB Inject 50 mLs (2 g total) into the vein daily.  . Melatonin 3 MG CAPS Take 3 mg by mouth at bedtime as needed (sleep).   . metroNIDAZOLE (FLAGYL) 500 MG tablet Take 1 tablet (500 mg total) by mouth 3 (three) times daily.  . Multiple Vitamins-Minerals (MULTIVITAMIN WITH MINERALS) tablet Take 1 tablet by mouth daily.  . Rivaroxaban (XARELTO STARTER PACK) 15 & 20 MG TBPK Take as directed on package: Start with one 15mg  tablet by mouth twice a day with food. On Day 22 (August 17th), switch to one 20mg  tablet once a day with food. (Patient taking differently: Take 1 tablet (15 mg) by mouth twice a day with food for 21 Days. On Day 22 switch to one tablet (20 mg) by mouth once a day with food.)  . [DISCONTINUED] feeding supplement, ENSURE ENLIVE, (ENSURE ENLIVE) LIQD Take 237 mLs by mouth 2 (two) times daily between meals. (Patient taking differently: Take 237 mLs by mouth 3 (three) times daily between meals. )  . [DISCONTINUED] famotidine (PEPCID) 20 MG tablet Take 20 mg by mouth daily as needed for heartburn or indigestion.  . [DISCONTINUED] omeprazole (PRILOSEC) 20 MG capsule Take 20 mg by mouth daily as needed (indigestion).     No facility-administered encounter medications on file as of 09/25/2014.     Patient Active Problem List   Diagnosis Date Noted  . Liver abscess 08/29/2014  . Elevated LFTs 08/29/2014  . Normocytic anemia 08/29/2014  . Poor dentition 08/29/2014  . Protein-calorie malnutrition, severe 08/25/2014  . Liver metastases 08/24/2014  . Moderate malnutrition 08/24/2014  . Liver masses 08/22/2014  . Portal vein thrombosis 08/22/2014     Health Maintenance Due  Topic Date Due  . HIV Screening  06/15/1967  . TETANUS/TDAP  06/15/1971  . COLONOSCOPY  06/15/2002  . ZOSTAVAX  06/14/2012  . INFLUENZA VACCINE  09/01/2014     Review of Systems + weight loss, no worsening. 10 point ros is negative Physical Exam   BP 128/85 mmHg  Pulse 63  Temp(Src) 98.1 F (36.7 C) (Oral)  Ht 5\' 10"  (1.778 m)  Wt 165 lb (74.844 kg)  BMI 23.68 kg/m2  Physical Exam  Constitutional: He is oriented to person, place, and time. He appears well-developed and well-nourished. No distress.  HENT:  Mouth/Throat: Oropharynx is clear and moist. No oropharyngeal exudate.  Cardiovascular: Normal rate, regular rhythm and normal heart sounds. Exam reveals no gallop and no friction rub.  No murmur heard.  Pulmonary/Chest: Effort normal and breath sounds normal. No respiratory distress. He has no wheezes.  Abdominal: Soft. Bowel sounds are normal. He exhibits no distension. There is no  tenderness.  Lymphadenopathy:  He has no cervical adenopathy.  Ext: right arm picc line c/d/i Skin: Skin is warm and dry. No rash noted. No erythema.  Psychiatric: He has a normal mood and affect. His behavior is normal.    CBC Lab Results  Component Value Date   WBC 8.4 09/04/2014   RBC 3.09* 09/04/2014   HGB 9.3* 09/04/2014   HCT 30.0* 09/04/2014   PLT 272 09/04/2014   MCV 97.1 09/04/2014   MCH 30.1 09/04/2014   MCHC 31.0 09/04/2014   RDW 14.9 09/04/2014   LYMPHSABS 1.2 09/04/2014   MONOABS 0.5 09/04/2014   EOSABS 0.0  09/04/2014   BASOSABS 0.0 09/04/2014   BMET Lab Results  Component Value Date   NA 137 09/04/2014   K 4.0 09/04/2014   CL 106 09/04/2014   CO2 23 09/04/2014   GLUCOSE 215* 09/04/2014   BUN 16 09/04/2014   CREATININE 0.94 09/04/2014   CALCIUM 8.4* 09/04/2014   GFRNONAA >60 09/04/2014   GFRAA >60 09/04/2014   [place]: IMPRESSION: Heterogeneous appearance throughout much of the right hepatic lobe. There are focal low-density areas inferiorly within the peripheral right hepatic lobe corresponding to the areas biopsied previously. Recommend correlation with prior biopsy results. Comparison to outside CT would be beneficial to assess for change.  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  Left colonic diverticulosis.  10 mm calcification appears to be with in the left distal ureter at the UVJ. No evidence of hydronephrosis/obstruction.  i reviewed his hospitalization records, imaging and culture results as they pertain to hepatic abscess  Assessment and Plan  Liver abscess= plan on repeat abd ct to determine, need for prolonged oral antibiotics. Can convert to augmentin 875mg  bid for addn month if repeat abd ct still shows evidence of residual hepatic abscess.

## 2014-09-25 NOTE — Telephone Encounter (Signed)
Verbal order per Dr. Baxter Flattery given to [name] at [facility] to pull patients picc line at the end of IV antibiotic therapy; end date is 10/11/14. [name]

## 2014-09-26 ENCOUNTER — Telehealth: Payer: Self-pay | Admitting: *Deleted

## 2014-09-26 NOTE — Telephone Encounter (Signed)
Order placed on Marc Tyler, desk for prior [name] (she is out today). Once this is done the CT will be scheduled and I will notify patient. Dr. Baxter Flattery will review and then decide if patient needs further follow up at ID. [name]

## 2014-10-22 ENCOUNTER — Telehealth: Payer: Self-pay | Admitting: *Deleted

## 2014-10-22 NOTE — Telephone Encounter (Signed)
Called patient and notified him of appt for CT scan on 10/30/14 at 8:45 AM at [facility]. Patient aware of liquids only for four hours prior to test. He was given the # [phone] for directions. [name]

## 2014-10-23 ENCOUNTER — Other Ambulatory Visit: Payer: Self-pay | Admitting: Internal Medicine

## 2014-10-23 DIAGNOSIS — I10 Essential (primary) hypertension: Secondary | ICD-10-CM

## 2014-10-30 ENCOUNTER — Ambulatory Visit (HOSPITAL_COMMUNITY)
Admission: RE | Admit: 2014-10-30 | Discharge: 2014-10-30 | Disposition: A | Discharge status: Home / Self Care | Payer: 59 | Source: Ambulatory Visit | Attending: Internal Medicine | Admitting: Internal Medicine

## 2014-10-30 DIAGNOSIS — R160 Hepatomegaly, not elsewhere classified: Secondary | ICD-10-CM | POA: Diagnosis not present

## 2014-10-30 DIAGNOSIS — K750 Abscess of liver: Secondary | ICD-10-CM | POA: Diagnosis present

## 2014-10-30 DIAGNOSIS — I81 Portal vein thrombosis: Secondary | ICD-10-CM | POA: Diagnosis not present

## 2014-10-30 LAB — POCT I-STAT CREATININE: CREATININE: 0.9 mg/dL (ref 0.61–1.24)

## 2014-10-30 MED ORDER — IOHEXOL 300 MG/ML  SOLN
100.0000 mL | Freq: Once | INTRAMUSCULAR | Status: AC | PRN
  Administered 2014-10-30: 100 mL via INTRAVENOUS

## 2014-11-12 ENCOUNTER — Telehealth: Payer: Self-pay | Admitting: Internal Medicine

## 2014-11-12 NOTE — Telephone Encounter (Signed)
Left message for patient that he no longer needs antibiotics since ct results looked like resolution of his liver abscess. Left msg to call me on my cell

## 2014-11-14 NOTE — Telephone Encounter (Signed)
Patient did not get this message and wanted Dr. Baxter Flattery to call him back. I offered him Dr. Storm Frisk cell phone number but he refused. Spoke with Dr. Baxter Flattery and she is returning the call

## 2014-11-17 ENCOUNTER — Ambulatory Visit: Payer: 59 | Admitting: Internal Medicine

## 2017-02-06 IMAGING — CT CT ABDOMEN W/ CM
2 of 5 series · 16 of 46 positions shown, 18 images · IV contrast (OMNIPAQUE 300)
Comparison: Ultrasound biopsy 08/25/2014

CLINICAL DATA: Abdominal pain, weight loss. Prior outside CT
demonstrated liver lesions. Recent biopsy showed abscess.

EXAM:
CT ABDOMEN WITH CONTRAST
TECHNIQUE: Multidetector CT imaging of the abdomen was performed using the
standard protocol following bolus administration of intravenous
contrast.
CONTRAST:  25mL OMNIPAQUE IOHEXOL 300 MG/ML SOLN, 100mL OMNIPAQUE
IOHEXOL 300 MG/ML SOLN

[Series 2: abd/pel with · axial · 0.72mm/px · z∈[+1139,+1564]mm · 13 of 96 slices shown, 15 images]
[im 6/96  soft-tissue]
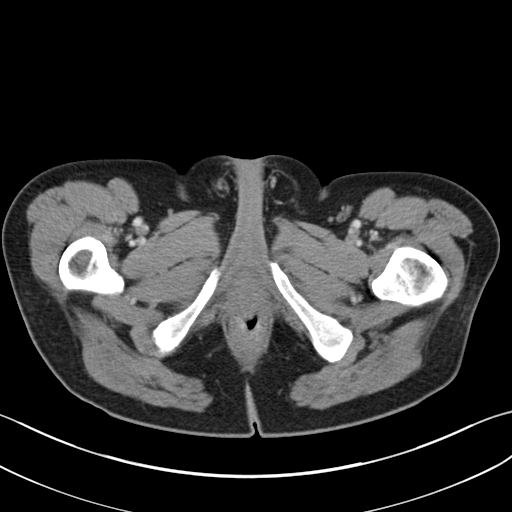
[im 6/96  bone]
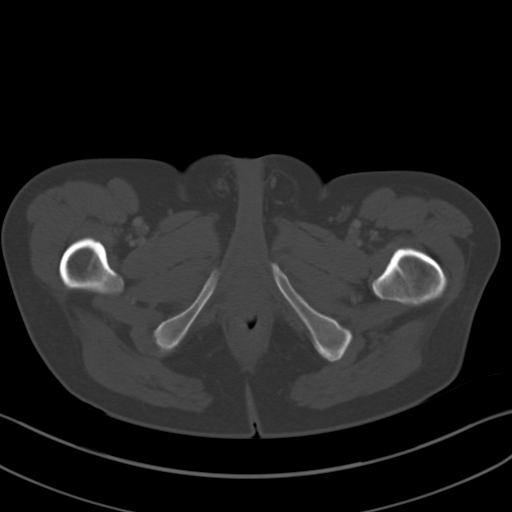
[im 16/96  soft-tissue]
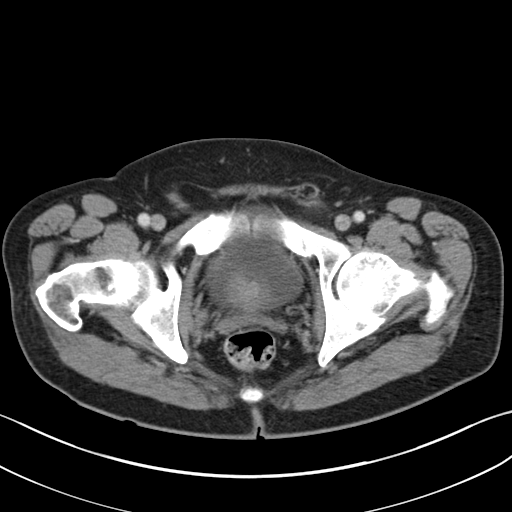
[im 21/96  soft-tissue]
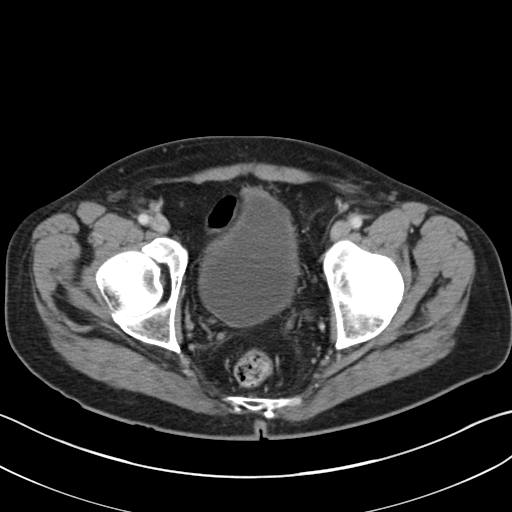
[im 26/96  soft-tissue]
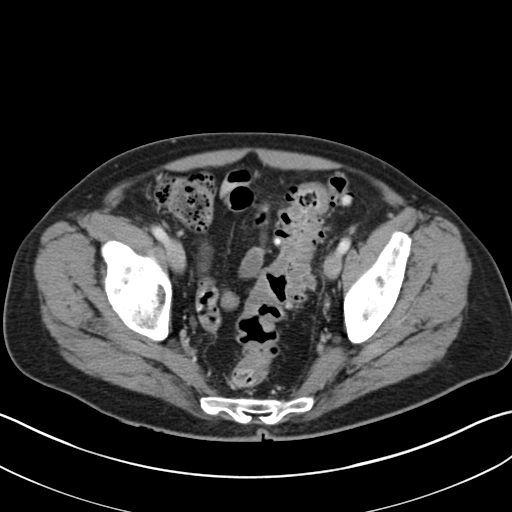
[im 36/96  soft-tissue]
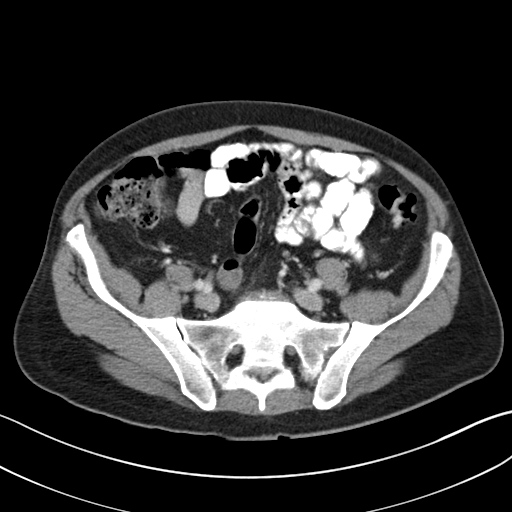
[im 41/96  soft-tissue]
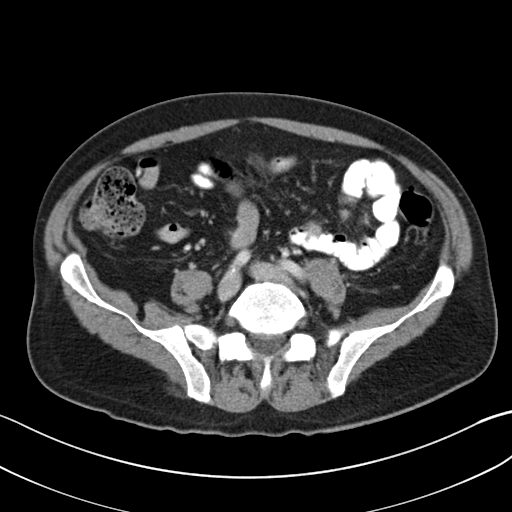
[im 51/96  soft-tissue]
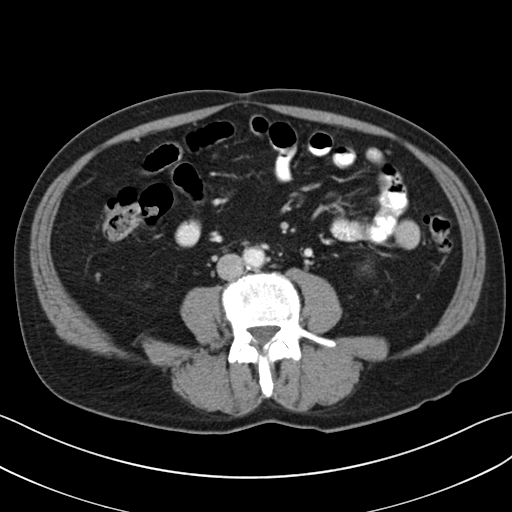
[im 56/96  soft-tissue]
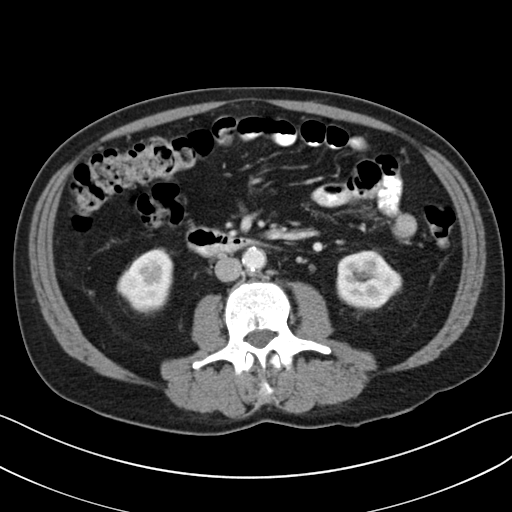
[im 61/96  soft-tissue]
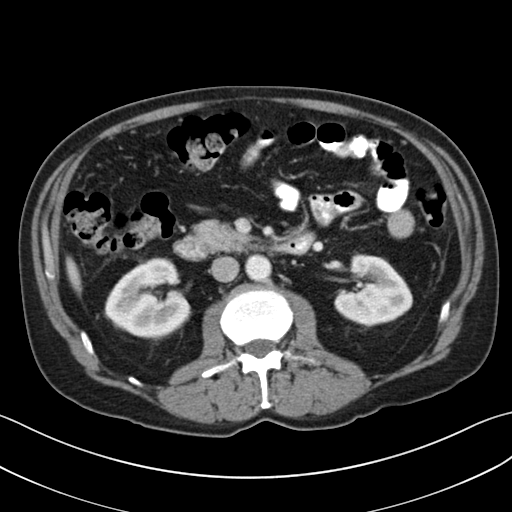
[im 61/96  bone]
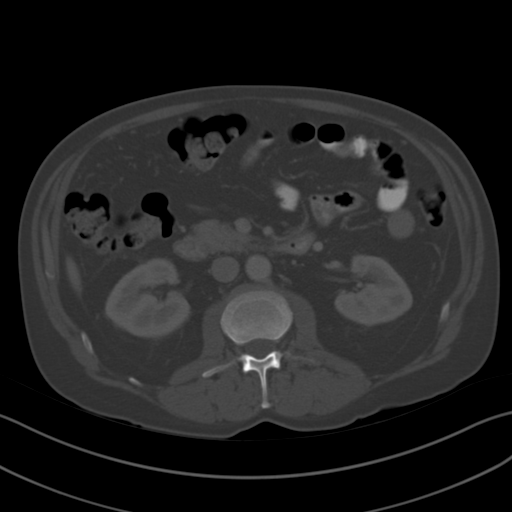
[im 71/96  soft-tissue]
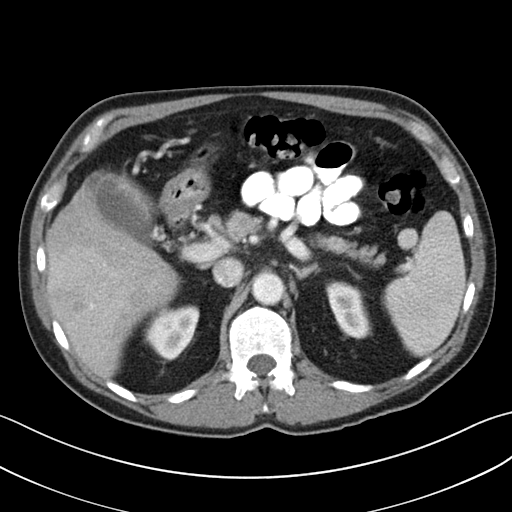
[im 76/96  soft-tissue]
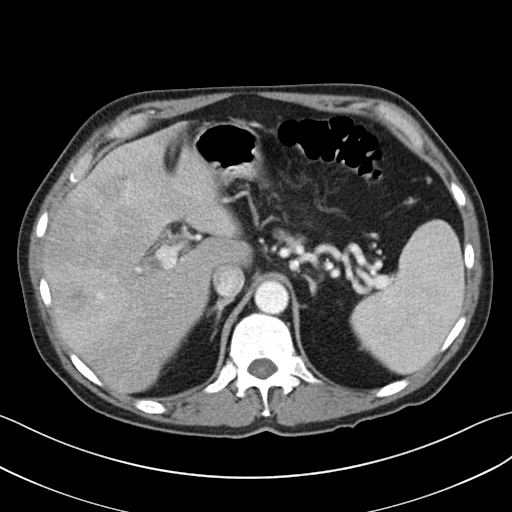
[im 81/96  soft-tissue]
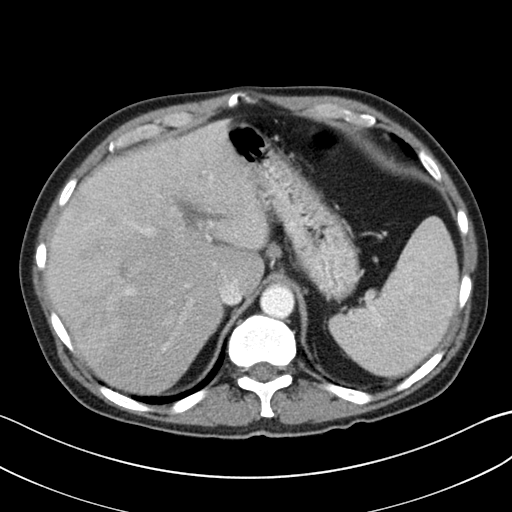
[im 91/96  soft-tissue]
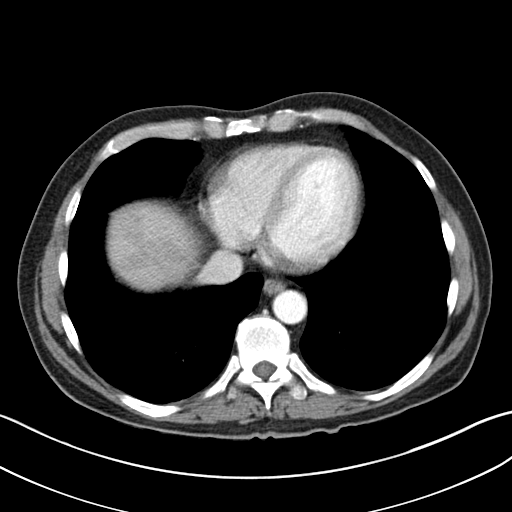

[Series 3: coronal a/|p · coronal · 0.74mm/px · 3 of 88 slices shown]
[im 30/88  soft-tissue]
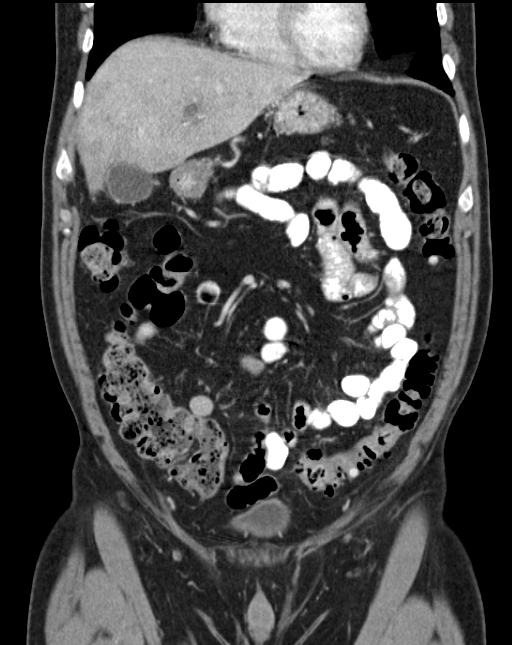
[im 39/88  soft-tissue]
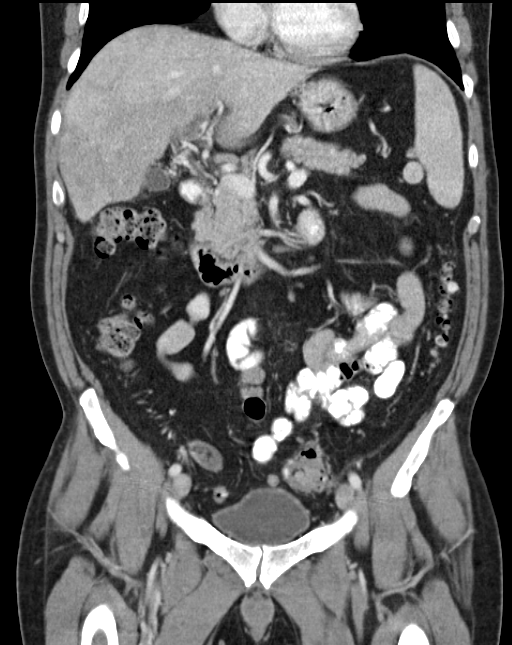
[im 49/88  soft-tissue]
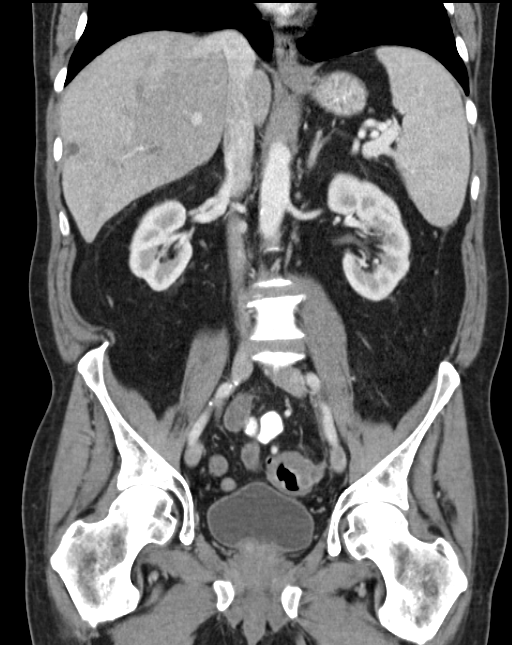

[16 of 46 positions shown; findings below may reference images not displayed]

FINDINGS: Lung bases are clear.  No effusions.  Heart is normal size.

Abnormal heterogeneous appearance throughout much of the right
hepatic lobe peripherally. Focal low-density areas are noted
peripherally in the mid to lower right hepatic lobe. This is likely
the area previously biopsied. Recommend correlation with biopsy
results. Thrombosis of the left portal vein and anterior branches of
the right portal vein. Main portal vein remains patent.

Spleen, gallbladder, pancreas, adrenals and kidneys are
unremarkable. Descending colonic and sigmoid diverticulosis. No
active diverticulitis. Stomach and small bowel are unremarkable.
Aorta and iliac vessels are non aneurysmal with scattered
calcifications. No free fluid, free air or adenopathy.

There is a calcification in the left side of the pelvis which
appears to be at the left ureterovesical junction measuring 10 mm.
No associated hydronephrosis. Urinary bladder is unremarkable.

Small left inguinal hernia containing fat. No acute bony abnormality
or focal bone lesion.
IMPRESSION: Heterogeneous appearance throughout much of the right hepatic lobe.
There are focal low-density areas inferiorly within the peripheral
right hepatic lobe corresponding to the areas biopsied previously.
Recommend correlation with prior biopsy results. Comparison to
outside CT would be beneficial to assess for change.

Left portal vein thrombosis and partial thrombosis of the right
portal vein.

Left colonic diverticulosis.

10 mm calcification appears to be with in the left distal ureter at
the UVJ. No evidence of hydronephrosis/obstruction.

## 2017-08-25 DIAGNOSIS — H353131 Nonexudative age-related macular degeneration, bilateral, early dry stage: Secondary | ICD-10-CM | POA: Diagnosis not present

## 2017-08-25 DIAGNOSIS — D3132 Benign neoplasm of left choroid: Secondary | ICD-10-CM | POA: Diagnosis not present

## 2017-08-25 DIAGNOSIS — H3530 Unspecified macular degeneration: Secondary | ICD-10-CM | POA: Diagnosis not present

## 2017-08-25 DIAGNOSIS — H2513 Age-related nuclear cataract, bilateral: Secondary | ICD-10-CM | POA: Diagnosis not present

## 2017-09-20 DIAGNOSIS — Z125 Encounter for screening for malignant neoplasm of prostate: Secondary | ICD-10-CM | POA: Diagnosis not present

## 2017-09-20 DIAGNOSIS — Z23 Encounter for immunization: Secondary | ICD-10-CM | POA: Diagnosis not present

## 2017-09-20 DIAGNOSIS — R238 Other skin changes: Secondary | ICD-10-CM | POA: Diagnosis not present

## 2017-09-20 DIAGNOSIS — Z0000 Encounter for general adult medical examination without abnormal findings: Secondary | ICD-10-CM | POA: Diagnosis not present

## 2017-09-20 DIAGNOSIS — Z87828 Personal history of other (healed) physical injury and trauma: Secondary | ICD-10-CM | POA: Diagnosis not present

## 2017-09-20 DIAGNOSIS — E7800 Pure hypercholesterolemia, unspecified: Secondary | ICD-10-CM | POA: Diagnosis not present

## 2017-09-21 ENCOUNTER — Other Ambulatory Visit: Payer: Self-pay | Admitting: Family Medicine

## 2017-09-21 DIAGNOSIS — Z0000 Encounter for general adult medical examination without abnormal findings: Secondary | ICD-10-CM

## 2017-10-03 ENCOUNTER — Ambulatory Visit
Admission: RE | Admit: 2017-10-03 | Discharge: 2017-10-03 | Disposition: A | Discharge status: Home / Self Care | Payer: Medicare Other | Source: Ambulatory Visit | Attending: Family Medicine | Admitting: Family Medicine

## 2017-10-03 DIAGNOSIS — Z136 Encounter for screening for cardiovascular disorders: Secondary | ICD-10-CM | POA: Diagnosis not present

## 2017-10-03 DIAGNOSIS — Z87891 Personal history of nicotine dependence: Secondary | ICD-10-CM | POA: Diagnosis not present

## 2017-10-03 DIAGNOSIS — Z0000 Encounter for general adult medical examination without abnormal findings: Secondary | ICD-10-CM

## 2017-10-09 DIAGNOSIS — L814 Other melanin hyperpigmentation: Secondary | ICD-10-CM | POA: Diagnosis not present

## 2017-10-09 DIAGNOSIS — L821 Other seborrheic keratosis: Secondary | ICD-10-CM | POA: Diagnosis not present

## 2017-10-09 DIAGNOSIS — D1801 Hemangioma of skin and subcutaneous tissue: Secondary | ICD-10-CM | POA: Diagnosis not present

## 2017-10-09 DIAGNOSIS — L819 Disorder of pigmentation, unspecified: Secondary | ICD-10-CM | POA: Diagnosis not present

## 2017-10-09 DIAGNOSIS — D229 Melanocytic nevi, unspecified: Secondary | ICD-10-CM | POA: Diagnosis not present

## 2017-10-24 DIAGNOSIS — Z23 Encounter for immunization: Secondary | ICD-10-CM | POA: Diagnosis not present

## 2018-03-28 DIAGNOSIS — Z79899 Other long term (current) drug therapy: Secondary | ICD-10-CM | POA: Diagnosis not present

## 2018-03-28 DIAGNOSIS — E7800 Pure hypercholesterolemia, unspecified: Secondary | ICD-10-CM | POA: Diagnosis not present

## 2018-05-06 ENCOUNTER — Other Ambulatory Visit: Payer: Self-pay

## 2018-05-06 ENCOUNTER — Encounter (HOSPITAL_COMMUNITY): Payer: Self-pay

## 2018-05-06 ENCOUNTER — Inpatient Hospital Stay (HOSPITAL_COMMUNITY)
Admission: EM | Admit: 2018-05-06 | Discharge: 2018-05-08 | DRG: 378 | Disposition: A | Discharge status: Home / Self Care | Payer: Medicare Other | Attending: Internal Medicine | Admitting: Internal Medicine

## 2018-05-06 DIAGNOSIS — I8500 Esophageal varices without bleeding: Secondary | ICD-10-CM | POA: Diagnosis not present

## 2018-05-06 DIAGNOSIS — D6959 Other secondary thrombocytopenia: Secondary | ICD-10-CM | POA: Diagnosis present

## 2018-05-06 DIAGNOSIS — K922 Gastrointestinal hemorrhage, unspecified: Secondary | ICD-10-CM | POA: Diagnosis not present

## 2018-05-06 DIAGNOSIS — D62 Acute posthemorrhagic anemia: Secondary | ICD-10-CM | POA: Diagnosis present

## 2018-05-06 DIAGNOSIS — K3189 Other diseases of stomach and duodenum: Secondary | ICD-10-CM | POA: Diagnosis present

## 2018-05-06 DIAGNOSIS — K766 Portal hypertension: Secondary | ICD-10-CM | POA: Diagnosis not present

## 2018-05-06 DIAGNOSIS — K921 Melena: Secondary | ICD-10-CM | POA: Diagnosis not present

## 2018-05-06 DIAGNOSIS — K264 Chronic or unspecified duodenal ulcer with hemorrhage: Secondary | ICD-10-CM | POA: Diagnosis not present

## 2018-05-06 DIAGNOSIS — E785 Hyperlipidemia, unspecified: Secondary | ICD-10-CM | POA: Diagnosis present

## 2018-05-06 DIAGNOSIS — E1165 Type 2 diabetes mellitus with hyperglycemia: Secondary | ICD-10-CM | POA: Diagnosis present

## 2018-05-06 DIAGNOSIS — Z87891 Personal history of nicotine dependence: Secondary | ICD-10-CM

## 2018-05-06 DIAGNOSIS — K7460 Unspecified cirrhosis of liver: Secondary | ICD-10-CM | POA: Diagnosis not present

## 2018-05-06 DIAGNOSIS — Z79890 Hormone replacement therapy (postmenopausal): Secondary | ICD-10-CM

## 2018-05-06 DIAGNOSIS — D649 Anemia, unspecified: Secondary | ICD-10-CM | POA: Diagnosis not present

## 2018-05-06 DIAGNOSIS — Z7901 Long term (current) use of anticoagulants: Secondary | ICD-10-CM

## 2018-05-06 DIAGNOSIS — E860 Dehydration: Secondary | ICD-10-CM | POA: Diagnosis present

## 2018-05-06 DIAGNOSIS — Z86718 Personal history of other venous thrombosis and embolism: Secondary | ICD-10-CM

## 2018-05-06 DIAGNOSIS — Z79899 Other long term (current) drug therapy: Secondary | ICD-10-CM

## 2018-05-06 DIAGNOSIS — Z86010 Personal history of colonic polyps: Secondary | ICD-10-CM

## 2018-05-06 LAB — CBC
HCT: 24.4 % — ABNORMAL LOW (ref 39.0–52.0)
Hemoglobin: 7.6 g/dL — ABNORMAL LOW (ref 13.0–17.0)
MCH: 29.8 pg (ref 26.0–34.0)
MCHC: 31.1 g/dL (ref 30.0–36.0)
MCV: 95.7 fL (ref 80.0–100.0)
Platelets: 110 10*3/uL — ABNORMAL LOW (ref 150–400)
RBC: 2.55 MIL/uL — ABNORMAL LOW (ref 4.22–5.81)
RDW: 14.3 % (ref 11.5–15.5)
WBC: 12.2 10*3/uL — ABNORMAL HIGH (ref 4.0–10.5)
nRBC: 0 % (ref 0.0–0.2)

## 2018-05-06 LAB — COMPREHENSIVE METABOLIC PANEL
ALT: 31 U/L (ref 0–44)
AST: 36 U/L (ref 15–41)
Albumin: 3.4 g/dL — ABNORMAL LOW (ref 3.5–5.0)
Alkaline Phosphatase: 66 U/L (ref 38–126)
Anion gap: 10 (ref 5–15)
BUN: 52 mg/dL — ABNORMAL HIGH (ref 8–23)
CO2: 20 mmol/L — ABNORMAL LOW (ref 22–32)
Calcium: 8.4 mg/dL — ABNORMAL LOW (ref 8.9–10.3)
Chloride: 106 mmol/L (ref 98–111)
Creatinine, Ser: 0.83 mg/dL (ref 0.61–1.24)
GFR calc Af Amer: >60 mL/min (ref >60)
GFR calc non Af Amer: >60 mL/min (ref >60)
Glucose, Bld: 210 mg/dL — ABNORMAL HIGH (ref 70–99)
Potassium: 4.1 mmol/L (ref 3.5–5.1)
Sodium: 136 mmol/L (ref 135–145)
Total Bilirubin: 0.5 mg/dL (ref 0.3–1.2)
Total Protein: 5.8 g/dL — ABNORMAL LOW (ref 6.5–8.1)

## 2018-05-06 LAB — POC OCCULT BLOOD, ED: Fecal Occult Bld: POSITIVE — AB

## 2018-05-06 LAB — GLUCOSE, CAPILLARY
Glucose-Capillary: 103 mg/dL — ABNORMAL HIGH (ref 70–99)
Glucose-Capillary: 106 mg/dL — ABNORMAL HIGH (ref 70–99)

## 2018-05-06 LAB — PREPARE RBC (CROSSMATCH)

## 2018-05-06 MED ORDER — ROSUVASTATIN CALCIUM 20 MG PO TABS
20.0000 mg | ORAL_TABLET | Freq: Every day | ORAL | Status: DC
  Administered 2018-05-08: 10:00:00 20 mg via ORAL
  Filled 2018-05-06 (×3): qty 1

## 2018-05-06 MED ORDER — SODIUM CHLORIDE 0.9 % IV SOLN
80.0000 mg | Freq: Once | INTRAVENOUS | Status: AC
  Administered 2018-05-06: 80 mg via INTRAVENOUS
  Filled 2018-05-06: qty 80

## 2018-05-06 MED ORDER — INSULIN ASPART 100 UNIT/ML ~~LOC~~ SOLN: 0.0000 [IU] | Freq: Three times a day (TID) | SUBCUTANEOUS | Status: DC

## 2018-05-06 MED ORDER — SODIUM CHLORIDE 0.9 % IV BOLUS
1000.0000 mL | Freq: Once | INTRAVENOUS | Status: AC
  Administered 2018-05-06: 1000 mL via INTRAVENOUS

## 2018-05-06 MED ORDER — ADULT MULTIVITAMIN W/MINERALS CH
1.0000 | ORAL_TABLET | Freq: Every day | ORAL | Status: DC
  Administered 2018-05-08: 1 via ORAL
  Filled 2018-05-06 (×4): qty 1

## 2018-05-06 MED ORDER — ONDANSETRON HCL 4 MG/2ML IJ SOLN: 4.0000 mg | Freq: Four times a day (QID) | INTRAMUSCULAR | Status: DC | PRN

## 2018-05-06 MED ORDER — LACTATED RINGERS IV SOLN
INTRAVENOUS | Status: DC
  Administered 2018-05-06 – 2018-05-08 (×5): via INTRAVENOUS

## 2018-05-06 MED ORDER — SODIUM CHLORIDE 0.9 % IV SOLN
8.0000 mg/h | INTRAVENOUS | Status: DC
  Administered 2018-05-06 – 2018-05-07 (×2): 8 mg/h via INTRAVENOUS
  Filled 2018-05-06 (×3): qty 80

## 2018-05-06 MED ORDER — MELATONIN 5 MG PO TABS
10.0000 mg | ORAL_TABLET | Freq: Every evening | ORAL | Status: DC | PRN
  Administered 2018-05-07: 01:00:00 10 mg via ORAL
  Filled 2018-05-06 (×2): qty 2

## 2018-05-06 MED ORDER — SODIUM CHLORIDE 0.9% IV SOLUTION
Freq: Once | INTRAVENOUS | Status: AC
  Administered 2018-05-06: 16:00:00 via INTRAVENOUS

## 2018-05-06 NOTE — ED Triage Notes (Signed)
Pt c/o black tarry stools x3 days. Pt sts he has been exercising more over the past few days, as well. Pt endorses lethargy and skin is very pale. Similar episode happened 6 months ago, but he did not seek medical attention, it resolved in 3 days. Denies fever, abdominal pain, or diarrhea.

## 2018-05-06 NOTE — H&P (Signed)
History and Physical  Marc Tyler YKD:[id number] DOB: 01/30/1951 DOA: 05/06/2018  Referring physician: PA Benedetto Goad PCP: Lawerance Cruel, MD  Outpatient Specialists: GI Patient coming from: Home  Chief Complaint: Dark stools for 3 days  HPI: [name] is a 68 y.o. male with medical history significant for prior liver abscess, portal vein thrombosis not currently on oral anticoagulation who presented to [facility] ED with complaints of black tarry stools of 3 days duration.  Denies any other symptoms such as abdominal pain or nausea, fever or chills, or sick contacts.  Endorses gradually worsening fatigue with dyspnea on exertion.  Denies chest pain.  ED Course: On presentation to the ED, vital signs remarkable for mild tachycardia and positive orthostatic vital signs.  Lab studies remarkable for drop in hemoglobin to 7.6 with baseline hemoglobin of 9.  Positive FOBT.  TRH asked to admit for GI bleed.  GI consulted by ED physician.  Review of Systems: Review of systems as noted in the HPI. All other systems reviewed and are negative.   Past Medical History:  Diagnosis Date  . Hyperlipidemia    Past Surgical History:  Procedure Laterality Date  . HERNIA REPAIR    . Portal vein thrombosis    . TREATMENT FISTULA ANAL      Social History:  reports that he has quit smoking. He has never used smokeless tobacco. He reports current alcohol use. He reports that he does not use drugs.   No Known Allergies  History reviewed. No pertinent family history.  No family history of GI disease.  Prior to Admission medications   Medication Sig Start Date End Date Taking? Authorizing Provider  ibuprofen (ADVIL,MOTRIN) 200 MG tablet Take 400-600 mg by mouth every 4 (four) hours as needed for pain.  11/13/06  Yes [provider]  Melatonin 10 MG CAPS Take 10 mg by mouth at bedtime as needed (sleep).    Yes [provider]  Multiple Vitamins-Minerals (MULTIVITAMIN WITH MINERALS)  tablet Take 1 tablet by mouth daily.   Yes [provider]  multivitamin-lutein (OCUVITE-LUTEIN) CAPS capsule Take 1 capsule by mouth daily.   Yes [provider]  rosuvastatin (CRESTOR) 20 MG tablet Take 20 mg by mouth daily. 03/16/18  Yes [provider]  Rivaroxaban (XARELTO STARTER PACK) 15 & 20 MG TBPK Take as directed on package: Start with one 15mg  tablet by mouth twice a day with food. On Day 22 (August 17th), switch to one 20mg  tablet once a day with food. Patient not taking: Reported on 05/06/2018 08/27/14   [name], MD    Physical Exam: BP (!) 152/65 (BP Location: Left Arm)   Pulse 96   Temp 98.1 F (36.7 C) (Oral)   Resp 18   Ht 5\' 10"  (1.778 m)   Wt 87.2 kg   SpO2 100%   BMI 27.58 kg/m   . General: 68 y.o. year-old male well developed well nourished in no acute distress.  Alert and oriented x3. . Cardiovascular: Regular rate and rhythm with no rubs or gallops.  No thyromegaly or JVD noted.  No lower extremity edema. 2/4 pulses in all 4 extremities. Marland Kitchen Respiratory: Clear to auscultation with no wheezes or rales. Good inspiratory effort. . Abdomen: Soft nontender nondistended with normal bowel sounds x4 quadrants. . Muskuloskeletal: No cyanosis, clubbing or edema noted bilaterally . Neuro: CN II-XII intact, strength, sensation, reflexes . Skin: No ulcerative lesions noted or rashes . Psychiatry: Judgement and insight appear normal. Mood is appropriate for  condition and setting          Labs on Admission:  Basic Metabolic Panel: Recent Labs  Lab 05/06/18 1414  NA 136  K 4.1  CL 106  CO2 20*  GLUCOSE 210*  BUN 52*  CREATININE 0.83  CALCIUM 8.4*   Liver Function Tests: Recent Labs  Lab 05/06/18 1414  AST 36  ALT 31  ALKPHOS 66  BILITOT 0.5  PROT 5.8*  ALBUMIN 3.4*   No results for input(s): LIPASE, AMYLASE in the last 168 hours. No results for input(s): AMMONIA in the last 168 hours. CBC: Recent Labs  Lab 05/06/18 1414   WBC 12.2*  HGB 7.6*  HCT 24.4*  MCV 95.7  PLT 110*   Cardiac Enzymes: No results for input(s): CKTOTAL, CKMB, CKMBINDEX, TROPONINI in the last 168 hours.  BNP (last 3 results) No results for input(s): BNP in the last 8760 hours.  ProBNP (last 3 results) No results for input(s): PROBNP in the last 8760 hours.  CBG: No results for input(s): GLUCAP in the last 168 hours.  Radiological Exams on Admission: No results found.  EKG: I independently viewed the EKG done and my findings are as followed: None completed at the time of this visit.  Assessment/Plan Present on Admission: . GI bleed  Active Problems:   GI bleed  Presumed GI bleed Presented with drop in hemoglobin, black tarry stool with positive FOBT Hemoglobin on presentation 7.6 with baseline hemoglobin of 9 Elevated BUN 1 unit PRBC ordered by ED physician to be transfused GI consulted and will see the patient Clear liquid diet and n.p.o. after midnight Repeat CBC in the morning  Generalized weakness with positive orthostatic vital signs Suspect secondary to GI bleed superimposed by mild dehydration Start gentle IV fluid hydration lactated Ringer at 75 cc/h PT to assess tomorrow when medically stable  Thrombocytopenia Platelet 110 K on presentation Platelet level normal in 2016 Repeat CBC in the morning  Leukocytosis, possibly reactive Presented with WBC 12.2 Afebrile No sign of active infective process Repeat CBC in the morning  Type 2 diabetes with hyperglycemia Presented with chemistry blood sugar 210 Obtain A1c Start insulin sliding scale, sensitive  History of hepatic vein thrombosis Previously on Xarelto Patient stopped taking  Hyperlipidemia Resume Crestor    DVT prophylaxis: SCDs  Code Status: Full code  Family Communication: None at bedside  Disposition Plan: Admit to [place] called: GI called by ED physician  Admission status: Observation status    Kayleen Memos MD [facility] Pager [phone]  If 7PM-7AM, please contact night-coverage [email or web address] Password [facility]  05/06/2018, 5:12 PM

## 2018-05-06 NOTE — ED Provider Notes (Signed)
[facility] DEPT Provider Note   CSN: [id number] Arrival date & time: 05/06/18  1338    History   Chief Complaint Chief Complaint  Patient presents with  . Melena    x 3days    HPI Marc Tyler is a 68 y.o. male.     Marc Tyler is a 68 y.o. male with a history of prior liver abscess and portal vein thrombosis, who presents to the emergency department for evaluation of black tarry stools.  He reports that this is been occurring for 3 days.  He has not had any associated abdominal pain, no vomiting.  He reports that he has been trying to exercise more frequently and has been becoming increasingly fatigued and occasionally dyspneic with exercise.  He also noticed today that he looks much more pale than usual.  He has not had any bright red blood per rectum.  He reports he had a similar episode of dark tarry stools about 6 months ago that happened for a few days but then resolved on its own he did not have the same fatigue and generalized weakness so never presented for evaluation.  He has not currently on any blood thinners.  No other aggravating or alleviating factors.  Has not seen a GI doctor locally.     Past Medical History:  Diagnosis Date  . Hyperlipidemia     Patient Active Problem List   Diagnosis Date Noted  . Liver abscess 08/29/2014  . Elevated LFTs 08/29/2014  . Normocytic anemia 08/29/2014  . Poor dentition 08/29/2014  . Protein-calorie malnutrition, severe ([place]) 08/25/2014  . Liver metastases ([place]) 08/24/2014  . Moderate malnutrition ([place]) 08/24/2014  . Liver masses 08/22/2014  . Portal vein thrombosis 08/22/2014    Past Surgical History:  Procedure Laterality Date  . HERNIA REPAIR    . Portal vein thrombosis    . TREATMENT FISTULA ANAL          Home Medications    Prior to Admission medications   Medication Sig Start Date End Date Taking? Authorizing Provider  ALPRAZolam Duanne Moron) 0.25 MG tablet Take 1-2 tablets by  mouth 3 (three) times daily as needed for sleep. anxiety 08/19/14   [provider]  Ascorbic Acid (VITAMIN C PO) Take 1 tablet by mouth daily.    [provider]  Melatonin 3 MG CAPS Take 3 mg by mouth at bedtime as needed (sleep).     [provider]  Multiple Vitamins-Minerals (MULTIVITAMIN WITH MINERALS) tablet Take 1 tablet by mouth daily.    [provider]  Rivaroxaban (XARELTO STARTER PACK) 15 & 20 MG TBPK Take as directed on package: Start with one 15mg  tablet by mouth twice a day with food. On Day 22 (August 17th), switch to one 20mg  tablet once a day with food. Patient taking differently: Take 1 tablet (15 mg) by mouth twice a day with food for 21 Days. On Day 22 switch to one tablet (20 mg) by mouth once a day with food. 08/27/14   [name], MD    Family History History reviewed. No pertinent family history.  Social History Social History   Tobacco Use  . Smoking status: Former Research scientist (life sciences)  . Smokeless tobacco: Never Used  Substance Use Topics  . Alcohol use: Yes    Alcohol/week: 0.0 standard drinks    Comment: occasional  . Drug use: No     Allergies   Patient has no known allergies.   Review of Systems Review of Systems  Constitutional: Positive for fatigue. Negative for chills and fever.  HENT: Negative.   Eyes: Negative for visual disturbance.  Respiratory: Negative for cough and shortness of breath.   Cardiovascular: Negative for chest pain.  Gastrointestinal: Positive for blood in stool. Negative for abdominal pain, constipation, diarrhea and vomiting.  Genitourinary: Negative for dysuria, frequency and hematuria.  Skin: Positive for pallor.  Neurological: Positive for light-headedness. Negative for dizziness, syncope and headaches.     Physical Exam Updated Vital Signs BP 131/73 (BP Location: Left Arm)   Pulse (!) 107   Temp 98.1 F (36.7 C) (Oral)   Resp 17   Ht 5\' 10"  (1.778 m)   Wt 87.2 kg   SpO2 99%   BMI  27.58 kg/m   Physical Exam Vitals signs and nursing note reviewed.  Constitutional:      General: He is not in acute distress.    Appearance: Normal appearance. He is well-developed and normal weight. He is not ill-appearing, toxic-appearing or diaphoretic.     Comments: Patient is pale but overall well-appearing.  HENT:     Head: Normocephalic and atraumatic.     Mouth/Throat:     Mouth: Mucous membranes are moist.     Pharynx: Oropharynx is clear.  Eyes:     General:        Right eye: No discharge.        Left eye: No discharge.     Pupils: Pupils are equal, round, and reactive to light.     Comments: Conjunctiva pale  Neck:     Musculoskeletal: Neck supple.  Cardiovascular:     Rate and Rhythm: Regular rhythm. Tachycardia present.     Heart sounds: Normal heart sounds. No murmur. No friction rub. No gallop.   Pulmonary:     Effort: Pulmonary effort is normal. No respiratory distress.     Breath sounds: Normal breath sounds. No wheezing or rales.     Comments: Respirations equal and unlabored, patient able to speak in full sentences, lungs clear to auscultation bilaterally Abdominal:     General: Abdomen is flat. Bowel sounds are normal. There is no distension.     Palpations: Abdomen is soft. There is no mass.     Tenderness: There is no abdominal tenderness. There is no guarding.     Comments: Abdomen soft, nondistended, nontender to palpation in all quadrants without guarding or peritoneal signs  Genitourinary:    Rectum: Guaiac result positive.     Comments: Black tarry stools on rectal exam, no bright red blood Musculoskeletal:        General: No deformity.  Skin:    General: Skin is warm and dry.     Capillary Refill: Capillary refill takes less than 2 seconds.     Coloration: Skin is pale.  Neurological:     Mental Status: He is alert.     Coordination: Coordination normal.     Comments: Speech is clear, able to follow commands Moves extremities without ataxia,  coordination intact  Psychiatric:        Mood and Affect: Mood normal.        Behavior: Behavior normal.      ED Treatments / Results  Labs (all labs ordered are listed, but only abnormal results are displayed) Labs Reviewed  COMPREHENSIVE METABOLIC PANEL - Abnormal; Notable for the following components:      Result Value   CO2 20 (*)    Glucose, Bld 210 (*)    BUN 52 (*)  Calcium 8.4 (*)    Total Protein 5.8 (*)    Albumin 3.4 (*)    All other components within normal limits  CBC - Abnormal; Notable for the following components:   WBC 12.2 (*)    RBC 2.55 (*)    Hemoglobin 7.6 (*)    HCT 24.4 (*)    Platelets 110 (*)    All other components within normal limits  POC OCCULT BLOOD, ED - Abnormal; Notable for the following components:   Fecal Occult Bld POSITIVE (*)    All other components within normal limits  TYPE AND SCREEN  PREPARE RBC (CROSSMATCH)    EKG None  Radiology No results found.  Procedures .Critical Care Performed by: Jacqlyn Larsen, PA-C Authorized by: Jacqlyn Larsen, PA-C   Critical care provider statement:    Critical care time (minutes):  45   Critical care was necessary to treat or prevent imminent or life-threatening deterioration of the following conditions:  Circulatory failure (Upper GI bleed requiring blood transfusion)   Critical care was time spent personally by me on the following activities:  Discussions with consultants, evaluation of patient's response to treatment, examination of patient, ordering and performing treatments and interventions, ordering and review of laboratory studies, ordering and review of radiographic studies, pulse oximetry, re-evaluation of patient's condition, obtaining history from patient or surrogate and review of old charts   (including critical care time)  Medications Ordered in ED Medications  pantoprazole (PROTONIX) 80 mg in sodium chloride 0.9 % 250 mL (0.32 mg/mL) infusion (8 mg/hr Intravenous New  Bag/Given 05/06/18 1513)  0.9 %  sodium chloride infusion (Manually program via Guardrails IV Fluids) (has no administration in time range)  sodium chloride 0.9 % bolus 1,000 mL (0 mLs Intravenous Stopped 05/06/18 1515)  pantoprazole (PROTONIX) 80 mg in sodium chloride 0.9 % 100 mL IVPB (0 mg Intravenous Stopped 05/06/18 1515)     Initial Impression / Assessment and Plan / ED Course  I have reviewed the triage vital signs and the nursing notes.  Pertinent labs & imaging results that were available during my care of the patient were reviewed by me and considered in my medical decision making (see chart for details).  Patient presents to the emergency department for 3 days of melenic stools with increasing fatigue and generalized weakness.  On arrival patient appears very pale he is mildly tachycardic, all other vitals stable.  Abdomen is benign.  Black tarry stools noted on rectal exam.  Presentation concerning for upper GI bleed will check basic labs, type and screen.  Hemoccult positive.  Will start IV fluids and Protonix.  Will await hemoglobin before starting transfusion as patient is hemodynamically stable.  Hemoglobin is 7.6, patient also has mild leukocytosis of 12.2, BUN is elevated which is consistent with upper GI bleed patient also has elevated glucose of 210 and CO2 of 20, normal LFTs, no other acute electrolyte derangements.  Given hemoglobin of 7.6 with active bleeding will give 1 unit PRBCs.  Will consult GI and hospitalist for admission.  Case discussed with Dr. Penelope Coop with [facility] who will plan to see the patient, requests that he remain n.p.o., assuming patient remains stable will likely plan for endoscopy in the morning.  3:50 PM Case discussed with Dr. Nevada Crane with [facility] who will see and admit the patient.  Final Clinical Impressions(s) / ED Diagnoses   Final diagnoses:  Upper GI bleeding  Symptomatic anemia    ED Discharge Orders  None       Jacqlyn Larsen,  [place] 05/06/18 1551    Quintella Reichert, MD 05/07/18 [phone]

## 2018-05-06 NOTE — ED Notes (Addendum)
ED TO INPATIENT HANDOFF REPORT  ED Nurse Name and Phone #: Kandice Moos, [phone]- [phone]  S Name/Age/Gender Marc Tyler 68 y.o. male Room/Bed: WA14/WA14  Code Status   Code Status: Prior  Home/SNF/Other Home Patient oriented to: self, place, time and situation Is this baseline? Yes   Triage Complete: Triage complete  Chief Complaint Blood in Stool  Triage Note Pt c/o black tarry stools x3 days. Pt sts he has been exercising more over the past few days, as well. Pt endorses lethargy and skin is very pale. Similar episode happened 6 months ago, but he did not seek medical attention, it resolved in 3 days. Denies fever, abdominal pain, or diarrhea.   Allergies No Known Allergies  Level of Care/Admitting Diagnosis ED Disposition    ED Disposition Condition [facility] [[id number]]  Level of Care: Med-Surg [16]  Diagnosis: GI bleed [[id number]]  Admitting Physician: Kayleen Memos [[id number]]  Attending Physician: Kayleen Memos [[id number]]  PT Class (Do Not Modify): Observation [104]  PT Acc Code (Do Not Modify): Observation [10022]       B Medical/Surgery History Past Medical History:  Diagnosis Date  . Hyperlipidemia    Past Surgical History:  Procedure Laterality Date  . HERNIA REPAIR    . Portal vein thrombosis    . TREATMENT FISTULA ANAL       A IV Location/Drains/Wounds Patient Lines/Drains/Airways Status   Active Line/Drains/Airways    Name:   Placement date:   Placement time:   Site:   Days:   Peripheral IV 05/06/18 Right Hand   05/06/18    1411    Hand   less than 1   Peripheral IV 05/06/18 Right Forearm   05/06/18    1539    Forearm   less than 1   PICC / Midline Single Lumen 39/03/00 PICC Right Basilic 38 cm 0 cm   92/33/00    7622    Basilic   6333   Incision (Closed) 08/25/14  Right   08/25/14    1645     1350          Intake/Output Last 24 hours  Intake/Output Summary (Last 24 hours) at 05/06/2018  1621 Last data filed at 05/06/2018 1515 Gross per 24 hour  Intake 1100 ml  Output -  Net 1100 ml    Labs/Imaging Results for orders placed or performed during the hospital encounter of 05/06/18 (from the past 48 hour(s))  POC occult blood, ED     Status: Abnormal   Collection Time: 05/06/18  2:09 PM  Result Value Ref Range   Fecal Occult Bld POSITIVE (A) NEGATIVE  Type and screen [place]     Status: None (Preliminary result)   Collection Time: 05/06/18  2:10 PM  Result Value Ref Range   ABO/RH(D) O POS    Antibody Screen NEG    Sample Expiration      05/09/2018 Performed at [facility], [place] [address], [place], [place] [address]    Unit Number [id number]    Blood Component Type RED CELLS,LR    Unit division 00    Status of Unit ALLOCATED    Transfusion Status OK TO TRANSFUSE    Crossmatch Result Compatible   Comprehensive metabolic panel     Status: Abnormal   Collection Time: 05/06/18  2:14 PM  Result Value Ref Range   Sodium 136 135 - 145 mmol/L   Potassium  4.1 3.5 - 5.1 mmol/L   Chloride 106 98 - 111 mmol/L   CO2 20 (L) 22 - 32 mmol/L   Glucose, Bld 210 (H) 70 - 99 mg/dL   BUN 52 (H) 8 - 23 mg/dL   Creatinine, Ser 0.83 0.61 - 1.24 mg/dL   Calcium 8.4 (L) 8.9 - 10.3 mg/dL   Total Protein 5.8 (L) 6.5 - 8.1 g/dL   Albumin 3.4 (L) 3.5 - 5.0 g/dL   AST 36 15 - 41 U/L   ALT 31 0 - 44 U/L   Alkaline Phosphatase 66 38 - 126 U/L   Total Bilirubin 0.5 0.3 - 1.2 mg/dL   GFR calc non Af Amer >60 >60 mL/min   GFR calc Af Amer >60 >60 mL/min   Anion gap 10 5 - 15    Comment: Performed at [facility], [place] [address], [place], [place] [address]  CBC     Status: Abnormal   Collection Time: 05/06/18  2:14 PM  Result Value Ref Range   WBC 12.2 (H) 4.0 - 10.5 K/uL   RBC 2.55 (L) 4.22 - 5.81 MIL/uL   Hemoglobin 7.6 (L) 13.0 - 17.0 g/dL   HCT 24.4 (L) 39.0 - 52.0 %   MCV 95.7 80.0 - 100.0 fL   MCH 29.8 26.0 - 34.0  pg   MCHC 31.1 30.0 - 36.0 g/dL   RDW 14.3 11.5 - 15.5 %   Platelets 110 (L) 150 - 400 K/uL    Comment: REPEATED TO VERIFY PLATELET COUNT CONFIRMED BY SMEAR SPECIMEN CHECKED FOR CLOTS Immature Platelet Fraction may be clinically indicated, consider ordering this additional test [id number]    nRBC 0.0 0.0 - 0.2 %    Comment: Performed at [facility], [place] [address], [place], [place] [address]  Prepare RBC     Status: None   Collection Time: 05/06/18  3:14 PM  Result Value Ref Range   Order Confirmation      ORDER PROCESSED BY BLOOD BANK Performed at [place] [address] [place], [place] [address]    No results found.  Pending Labs Unresulted Labs (From admission, onward)   None      Vitals/Pain Today's Vitals   05/06/18 1343 05/06/18 1349  BP: 131/73   Pulse: (!) 107   Resp: 17   Temp: 98.1 F (36.7 C)   TempSrc: Oral   SpO2: 99%   Weight:  87.2 kg  Height:  5\' 10"  (1.778 m)  PainSc:  0-No pain    Isolation Precautions No active isolations  Medications Medications  pantoprazole (PROTONIX) 80 mg in sodium chloride 0.9 % 250 mL (0.32 mg/mL) infusion (8 mg/hr Intravenous New Bag/Given 05/06/18 1513)  sodium chloride 0.9 % bolus 1,000 mL (0 mLs Intravenous Stopped 05/06/18 1515)  pantoprazole (PROTONIX) 80 mg in sodium chloride 0.9 % 100 mL IVPB (0 mg Intravenous Stopped 05/06/18 1515)  0.9 %  sodium chloride infusion (Manually program via Guardrails IV Fluids) ( Intravenous New Bag/Given 05/06/18 1540)    Mobility walks Low fall risk   Focused Assessments GI bleed   R Recommendations: See Admitting Provider Note  Report given to:   Additional Notes: none

## 2018-05-07 ENCOUNTER — Encounter (HOSPITAL_COMMUNITY)
Admission: EM | Disposition: A | Discharge status: Home / Self Care | Payer: Self-pay | Source: Home / Self Care | Attending: Internal Medicine

## 2018-05-07 ENCOUNTER — Inpatient Hospital Stay (HOSPITAL_COMMUNITY): Payer: Medicare Other

## 2018-05-07 ENCOUNTER — Encounter (HOSPITAL_COMMUNITY): Payer: Self-pay | Admitting: Anesthesiology

## 2018-05-07 ENCOUNTER — Observation Stay (HOSPITAL_COMMUNITY): Payer: Medicare Other | Admitting: Anesthesiology

## 2018-05-07 DIAGNOSIS — Z79890 Hormone replacement therapy (postmenopausal): Secondary | ICD-10-CM | POA: Diagnosis not present

## 2018-05-07 DIAGNOSIS — K269 Duodenal ulcer, unspecified as acute or chronic, without hemorrhage or perforation: Secondary | ICD-10-CM | POA: Diagnosis not present

## 2018-05-07 DIAGNOSIS — E860 Dehydration: Secondary | ICD-10-CM | POA: Diagnosis present

## 2018-05-07 DIAGNOSIS — Z79899 Other long term (current) drug therapy: Secondary | ICD-10-CM | POA: Diagnosis not present

## 2018-05-07 DIAGNOSIS — D62 Acute posthemorrhagic anemia: Secondary | ICD-10-CM | POA: Diagnosis present

## 2018-05-07 DIAGNOSIS — Z86010 Personal history of colonic polyps: Secondary | ICD-10-CM | POA: Diagnosis not present

## 2018-05-07 DIAGNOSIS — I81 Portal vein thrombosis: Secondary | ICD-10-CM

## 2018-05-07 DIAGNOSIS — D696 Thrombocytopenia, unspecified: Secondary | ICD-10-CM

## 2018-05-07 DIAGNOSIS — D6959 Other secondary thrombocytopenia: Secondary | ICD-10-CM | POA: Diagnosis present

## 2018-05-07 DIAGNOSIS — K3189 Other diseases of stomach and duodenum: Secondary | ICD-10-CM

## 2018-05-07 DIAGNOSIS — K766 Portal hypertension: Secondary | ICD-10-CM | POA: Diagnosis present

## 2018-05-07 DIAGNOSIS — E1165 Type 2 diabetes mellitus with hyperglycemia: Secondary | ICD-10-CM | POA: Diagnosis present

## 2018-05-07 DIAGNOSIS — K921 Melena: Secondary | ICD-10-CM | POA: Diagnosis not present

## 2018-05-07 DIAGNOSIS — Z86718 Personal history of other venous thrombosis and embolism: Secondary | ICD-10-CM | POA: Diagnosis not present

## 2018-05-07 DIAGNOSIS — Z87891 Personal history of nicotine dependence: Secondary | ICD-10-CM | POA: Diagnosis not present

## 2018-05-07 DIAGNOSIS — K7460 Unspecified cirrhosis of liver: Secondary | ICD-10-CM | POA: Diagnosis present

## 2018-05-07 DIAGNOSIS — I8500 Esophageal varices without bleeding: Secondary | ICD-10-CM

## 2018-05-07 DIAGNOSIS — Z7901 Long term (current) use of anticoagulants: Secondary | ICD-10-CM | POA: Diagnosis not present

## 2018-05-07 DIAGNOSIS — D500 Iron deficiency anemia secondary to blood loss (chronic): Secondary | ICD-10-CM | POA: Diagnosis not present

## 2018-05-07 DIAGNOSIS — K264 Chronic or unspecified duodenal ulcer with hemorrhage: Secondary | ICD-10-CM | POA: Diagnosis present

## 2018-05-07 DIAGNOSIS — N200 Calculus of kidney: Secondary | ICD-10-CM | POA: Diagnosis not present

## 2018-05-07 DIAGNOSIS — E785 Hyperlipidemia, unspecified: Secondary | ICD-10-CM | POA: Diagnosis present

## 2018-05-07 HISTORY — PX: ESOPHAGOGASTRODUODENOSCOPY (EGD) WITH PROPOFOL: SHX5813

## 2018-05-07 LAB — CBC
HCT: 22.7 % — ABNORMAL LOW (ref 39.0–52.0)
Hemoglobin: 7.2 g/dL — ABNORMAL LOW (ref 13.0–17.0)
MCH: 30.3 pg (ref 26.0–34.0)
MCHC: 31.7 g/dL (ref 30.0–36.0)
MCV: 95.4 fL (ref 80.0–100.0)
Platelets: 76 10*3/uL — ABNORMAL LOW (ref 150–400)
RBC: 2.38 MIL/uL — ABNORMAL LOW (ref 4.22–5.81)
RDW: 15.7 % — ABNORMAL HIGH (ref 11.5–15.5)
WBC: 6.4 10*3/uL (ref 4.0–10.5)
nRBC: 0 % (ref 0.0–0.2)

## 2018-05-07 LAB — HEMOGLOBIN A1C
Hgb A1c MFr Bld: 5.5 % (ref 4.8–5.6)
Mean Plasma Glucose: 111.15 mg/dL

## 2018-05-07 LAB — GLUCOSE, CAPILLARY
Glucose-Capillary: 101 mg/dL — ABNORMAL HIGH (ref 70–99)
Glucose-Capillary: 110 mg/dL — ABNORMAL HIGH (ref 70–99)
Glucose-Capillary: 85 mg/dL (ref 70–99)
Glucose-Capillary: 91 mg/dL (ref 70–99)

## 2018-05-07 SURGERY — ESOPHAGOGASTRODUODENOSCOPY (EGD) WITH PROPOFOL
Anesthesia: General

## 2018-05-07 MED ORDER — PROPOFOL 10 MG/ML IV BOLUS
INTRAVENOUS | Status: DC | PRN
  Administered 2018-05-07: 170 mg via INTRAVENOUS

## 2018-05-07 MED ORDER — IOHEXOL 300 MG/ML  SOLN
100.0000 mL | Freq: Once | INTRAMUSCULAR | Status: AC | PRN
  Administered 2018-05-07: 100 mL via INTRAVENOUS

## 2018-05-07 MED ORDER — SUCCINYLCHOLINE CHLORIDE 200 MG/10ML IV SOSY
PREFILLED_SYRINGE | INTRAVENOUS | Status: DC | PRN
  Administered 2018-05-07: 100 mg via INTRAVENOUS

## 2018-05-07 MED ORDER — IOHEXOL 300 MG/ML  SOLN: 100.0000 mL | Freq: Once | INTRAMUSCULAR | Status: DC | PRN

## 2018-05-07 MED ORDER — ONDANSETRON HCL 4 MG/2ML IJ SOLN
INTRAMUSCULAR | Status: DC | PRN
  Administered 2018-05-07: 4 mg via INTRAVENOUS

## 2018-05-07 MED ORDER — PROPOFOL 10 MG/ML IV BOLUS
INTRAVENOUS | Status: AC
  Filled 2018-05-07: qty 20

## 2018-05-07 MED ORDER — FENTANYL CITRATE (PF) 100 MCG/2ML IJ SOLN
INTRAMUSCULAR | Status: DC | PRN
  Administered 2018-05-07: 50 ug via INTRAVENOUS

## 2018-05-07 MED ORDER — SODIUM CHLORIDE 0.9 % IV SOLN: INTRAVENOUS | Status: DC

## 2018-05-07 MED ORDER — FENTANYL CITRATE (PF) 100 MCG/2ML IJ SOLN
INTRAMUSCULAR | Status: AC
  Filled 2018-05-07: qty 2

## 2018-05-07 MED ORDER — PANTOPRAZOLE SODIUM 40 MG PO TBEC
40.0000 mg | DELAYED_RELEASE_TABLET | Freq: Every day | ORAL | Status: DC
  Administered 2018-05-07 – 2018-05-08 (×2): 40 mg via ORAL
  Filled 2018-05-07 (×2): qty 1

## 2018-05-07 MED ORDER — LIDOCAINE 2% (20 MG/ML) 5 ML SYRINGE
INTRAMUSCULAR | Status: DC | PRN
  Administered 2018-05-07: 100 mg via INTRAVENOUS

## 2018-05-07 SURGICAL SUPPLY — 14 items

## 2018-05-07 NOTE — Anesthesia Preprocedure Evaluation (Addendum)
Anesthesia Evaluation  Patient identified by MRN, date of birth, ID band Patient awake    Reviewed: Allergy & Precautions, NPO status , Patient's Chart, lab work & pertinent test results  History of Anesthesia Complications Negative for: history of anesthetic complications  Airway Mallampati: II  TM Distance: >3 FB Neck ROM: Full    Dental no notable dental hx.    Pulmonary neg pulmonary ROS, former smoker,    Pulmonary exam normal        Cardiovascular negative cardio ROS Normal cardiovascular exam     Neuro/Psych negative neurological ROS  negative psych ROS   GI/Hepatic H/o liver abscess & portal vein thrombosis UGIB   Endo/Other  negative endocrine ROS  Renal/GU negative Renal ROS  negative genitourinary   Musculoskeletal negative musculoskeletal ROS (+)   Abdominal   Peds  Hematology  (+) anemia ,   Anesthesia Other Findings   Reproductive/Obstetrics                            Anesthesia Physical Anesthesia Plan  ASA: III and emergent  Anesthesia Plan: General   Post-op Pain Management:    Induction: Intravenous and Rapid sequence  PONV Risk Score and Plan: 2 and Ondansetron, Dexamethasone and Treatment may vary due to age or medical condition  Airway Management Planned: Oral ETT  Additional Equipment: None  Intra-op Plan:   Post-operative Plan: Extubation in OR  Informed Consent: I have reviewed the patients History and Physical, chart, labs and discussed the procedure including the risks, benefits and alternatives for the proposed anesthesia with the patient or authorized representative who has indicated his/her understanding and acceptance.     Dental advisory given  Plan Discussed with:   Anesthesia Plan Comments:        Anesthesia Quick Evaluation

## 2018-05-07 NOTE — Progress Notes (Addendum)
PROGRESS NOTE    Marc Tyler  IZT:[id number] DOB: May 01, 1950 DOA: 05/06/2018 PCP: Lawerance Cruel, MD  Outpatient Specialists:   Brief Narrative:  Marc Tyler is a 68 year old male, with past medical history significant for  prior liver abscess and portal vein thrombosis not currently on oral anticoagulation.  Patient presented with 3-day history of black tarry stool.  On further questioning, patient admitted use of NSAIDs (ibuprofen).  Patient endorsed associated fatigue and dyspnea on exertion.  05/07/2018: Patient underwent EGD today.  EGD revealed grade 1 esophageal varices, portal hypertensive gastropathy and duodenal erosion.  Last hemoglobin checked was 7.2 g/dL (down from 7.6 g/dL).  Assessment & Plan:   Active Problems:   GI bleed  GI bleed, likely upper GI bleed:   Presented with drop in hemoglobin, black tarry stool with positive FOBT Hemoglobin on presentation 7.6 with baseline hemoglobin of 9 Hemoglobin today is 7.2 g/dL.   Continue to monitor H/H.   EGD findings as documented above.   Likely DC back home tomorrow if patient remains stable.   Patient has been counseled to quit NSAID use.  E  Acute blood loss anemia, symptomatic:  Generalized weakness with positive orthostatic vital signs Continue supportive care as well.   Continue to hydrate patient as deemed necessary.   Continue to monitor H/H.   PT consult.     Thrombocytopenia Platelet 110 K on presentation, and down to 70 6K today. Possible on diagnoses chronic liver disease/cirrhosis Check INR in a.m. History of portal vein thrombosis noted, and patient has grade 1 esophageal varices as per EGD.  Leukocytosis, possibly reactive Resolved.  Type 2 diabetes with hyperglycemia Presented with chemistry blood sugar 210 Hemoglobin A1c is 5.5%.    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  Objective: Vitals:   05/07/18 1141 05/07/18 1150 05/07/18 1157 05/07/18 1315  BP: (!) 140/55 (!) 145/55 134/62 126/72  Pulse: 86 79 76 78  Resp: 16 17 11 14   Temp: 98.3 F (36.8 C)   98.6 F (37 C)  TempSrc: Oral   Oral  SpO2: 100% 100% 100% 100%  Weight:      Height:        Intake/Output Summary (Last 24 hours) at 05/07/2018 1523 Last data filed at 05/07/2018 1400 Gross per 24 hour  Intake 2977.57 ml  Output 1150 ml  Net 1827.57 ml   Filed Weights   05/06/18 1349  Weight: 87.2 kg    Examination:  General exam: Appears calm and comfortable  Respiratory system: Clear to auscultation.   Cardiovascular system: S1 & S2 heard Gastrointestinal system: Abdomen is nondistended, soft and nontender.  Central nervous system: Alert and oriented.  Patient moves all extremities.  Extremities: No leg edema.  Data Reviewed: I have personally reviewed following labs and imaging studies  CBC: Recent Labs  Lab 05/06/18 1414 05/07/18 0303  WBC 12.2* 6.4  HGB 7.6* 7.2*  HCT 24.4* 22.7*  MCV 95.7 95.4  PLT 110* 76*   Basic Metabolic Panel: Recent Labs  Lab 05/06/18 1414  NA 136  K 4.1  CL 106  CO2 20*  GLUCOSE 210*  BUN 52*  CREATININE 0.83  CALCIUM 8.4*   GFR: Estimated Creatinine Clearance: 91.6 mL/min (by C-G formula based on SCr of 0.83  mg/dL). Liver Function Tests: Recent Labs  Lab 05/06/18 1414  AST 36  ALT 31  ALKPHOS 66  BILITOT 0.5  PROT 5.8*  ALBUMIN 3.4*   No results for input(s): LIPASE, AMYLASE in the last 168 hours. No results for input(s): AMMONIA in the last 168 hours. Coagulation Profile: No results for input(s): INR, PROTIME in the last 168 hours. Cardiac Enzymes: No results for input(s): CKTOTAL, CKMB, CKMBINDEX, TROPONINI in the last 168 hours. BNP (last 3 results) No results for  input(s): PROBNP in the last 8760 hours. HbA1C: Recent Labs    05/07/18 0303  HGBA1C 5.5   CBG: Recent Labs  Lab 05/06/18 1753 05/06/18 2135 05/07/18 0728 05/07/18 1229  GLUCAP 103* 106* 101* 110*   Lipid Profile: No results for input(s): CHOL, HDL, LDLCALC, TRIG, CHOLHDL, LDLDIRECT in the last 72 hours. Thyroid Function Tests: No results for input(s): TSH, T4TOTAL, FREET4, T3FREE, THYROIDAB in the last 72 hours. Anemia Panel: No results for input(s): VITAMINB12, FOLATE, FERRITIN, TIBC, IRON, RETICCTPCT in the last 72 hours. Urine analysis: No results found for: COLORURINE, APPEARANCEUR, LABSPEC, PHURINE, GLUCOSEU, HGBUR, BILIRUBINUR, KETONESUR, PROTEINUR, UROBILINOGEN, NITRITE, LEUKOCYTESUR Sepsis Labs: @LABRCNTIP (procalcitonin:4,lacticidven:4)  )No results found for this or any previous visit (from the past 240 hour(s)).       Radiology Studies: No results found.      Scheduled Meds: . insulin aspart  0-9 Units Subcutaneous TID WC  . multivitamin with minerals  1 tablet Oral Daily  . pantoprazole  40 mg Oral Daily  . rosuvastatin  20 mg Oral Daily   Continuous Infusions: . lactated ringers 75 mL/hr at 05/07/18 1454     LOS: 0 days    Time spent: 35 Minutes.    Dana Allan, MD  [facility] Pager #: [phone] 7PM-7AM contact night coverage as above

## 2018-05-07 NOTE — Anesthesia Postprocedure Evaluation (Signed)
Anesthesia Post Note  Patient: Marc Tyler  Procedure(s) Performed: ESOPHAGOGASTRODUODENOSCOPY (EGD) WITH PROPOFOL (N/A )     Patient location during evaluation: PACU Anesthesia Type: General Level of consciousness: awake and alert Pain management: pain level controlled Vital Signs Assessment: post-procedure vital signs reviewed and stable Respiratory status: spontaneous breathing, nonlabored ventilation and respiratory function stable Cardiovascular status: blood pressure returned to baseline and stable Postop Assessment: no apparent nausea or vomiting Anesthetic complications: no    Last Vitals:  Vitals:   05/07/18 1150 05/07/18 1157  BP: (!) 145/55 134/62  Pulse: 79 76  Resp: 17 11  Temp:    SpO2: 100% 100%    Last Pain:  Vitals:   05/07/18 1157  TempSrc:   PainSc: 0-No pain                 Lidia Collum

## 2018-05-07 NOTE — Progress Notes (Signed)
PT Cancellation Note  Patient Details Name: Marc Tyler MRN: [id number] DOB: 1950/11/10   Cancelled Treatment:    Reason Eval/Treat Not Completed: PT screened, no needs identified, will sign off Pt sitting on window seat on arrival and reports he has been up mobilizing.  Pt denies PT needs at this time. PT to sign off.    Sherrill Mckamie,KATHrine E 05/07/2018, 2:26 PM Carmelia Bake, PT, DPT Acute Rehabilitation Services Office: [phone] Pager: [phone]

## 2018-05-07 NOTE — Op Note (Signed)
[facility] Patient Name: Marc Tyler Procedure Date: 05/07/2018 MRN: [id number] Attending MD: Clarene Essex , MD Date of Birth: 11-18-1950 CSN: [id number] Age: 68 Admit Type: Inpatient Procedure:                Upper GI endoscopy Indications:              Acute post hemorrhagic anemia, Melena Providers:                Clarene Essex, MD, Cleda Daub, RN, Cherylynn Ridges,                            Technician, Danley Danker, CRNA Referring MD:              Medicines:                General Anesthesia Complications:            No immediate complications. Estimated Blood Loss:     Estimated blood loss: none. Procedure:                Pre-Anesthesia Assessment:                           - Prior to the procedure, a History and Physical                            was performed, and patient medications and                            allergies were reviewed. The patient's tolerance of                            previous anesthesia was also reviewed. The risks                            and benefits of the procedure and the sedation                            options and risks were discussed with the patient.                            All questions were answered, and informed consent                            was obtained. Prior Anticoagulants: The patient has                            taken no previous anticoagulant or antiplatelet                            agents. ASA Grade Assessment: II - A patient with                            mild systemic disease. After reviewing the risks  and benefits, the patient was deemed in                            satisfactory condition to undergo the procedure.                           After obtaining informed consent, the endoscope was                            passed under direct vision. Throughout the                            procedure, the patient's blood pressure, pulse, and                            oxygen  saturations were monitored continuously. The                            [id number]-[id number] ([id number] Olympus gastroscope was                            introduced through the mouth, and advanced to the                            fourth part of duodenum. The upper GI endoscopy was                            accomplished without difficulty. The patient                            tolerated the procedure well. Scope In: Scope Out: Findings:      Grade I varices were found in the lower third of the esophagus. There       was no stigmata of recent bleeding and they flattened out nicely      Very mild portal hypertensive gastropathy was found in the cardia, in       the gastric fundus and in the gastric body. No stigmata of recent       bleeding      A few localized superficial erosions without bleeding were found in the       duodenal bulb.      The second portion of the duodenum, third portion of the duodenum and       fourth portion of the duodenum were normal.      The exam was otherwise without abnormality. Impression:               - Grade I esophageal varices.                           - Portal hypertensive gastropathy.                           - Duodenal erosions without bleeding.                           - Normal second portion of the duodenum,  third                            portion of the duodenum and fourth portion of the                            duodenum.                           - The examination was otherwise normal.                           - No specimens collected. Moderate Sedation:      Not Applicable - Patient had care per Anesthesia. Recommendation:           - Soft diet today.                           - Continue present medications. Once a day pump                            inhibitor for now                           - Return to GI clinic PRN. Avoid aspirin and                            nonsteroidals in the future                           - Telephone GI clinic if  symptomatic PRN. Happy to                            see back in the office in follow-up or he can                            follow-up with his primary gastroenterology as an                            outpatient as well                           - Perform CT scan (computed tomography) of the                            abdomen with contrast today. To reevaluate the                            possible portal vein thrombosis and consider                            Inderal use as well Procedure Code(s):        --- Professional ---                           [phone], Esophagogastroduodenoscopy, flexible,  transoral; diagnostic, including collection of                            specimen(s) by brushing or washing, when performed                            (separate procedure) Diagnosis Code(s):        --- Professional ---                           I85.00, Esophageal varices without bleeding                           K76.6, Portal hypertension                           K31.89, Other diseases of stomach and duodenum                           K26.9, Duodenal ulcer, unspecified as acute or                            chronic, without hemorrhage or perforation                           D62, Acute posthemorrhagic anemia                           K92.1, Melena (includes Hematochezia) CPT copyright 2019 [employer]. All rights reserved. The codes documented in this report are preliminary and upon coder review may  be revised to meet current compliance requirements. Clarene Essex, MD 05/07/2018 11:37:46 AM This report has been signed electronically. Number of Addenda: 0

## 2018-05-07 NOTE — Discharge Instructions (Signed)
YOU HAD AN ENDOSCOPIC PROCEDURE TODAY: Refer to the procedure report and other information in the discharge instructions given to you for any specific questions about what was found during the examination. If this information does not answer your questions, please call [facility] office at [phone] to clarify.   YOU SHOULD EXPECT: Some feelings of bloating in the abdomen. Passage of more gas than usual. Walking can help get rid of the air that was put into your GI tract during the procedure and reduce the bloating. Some abdominal soreness may be present for a day or two, also.  DIET: Your first meal following the procedure should be a light meal and then it is ok to progress to your normal diet. A half-sandwich or bowl of soup is an example of a good first meal. Heavy or fried foods are harder to digest and may make you feel nauseous or bloated. Drink plenty of fluids but you should avoid alcoholic beverages for 24 hours. If you had a esophageal dilation, please see attached instructions for diet.   ACTIVITY: Your care partner should take you home directly after the procedure. You should plan to take it easy, moving slowly for the rest of the day. You can resume normal activity the day after the procedure however YOU SHOULD NOT DRIVE, use power tools, machinery or perform tasks that involve climbing or major physical exertion for 24 hours (because of the sedation medicines used during the test).   SYMPTOMS TO REPORT IMMEDIATELY: A gastroenterologist can be reached at any hour. Please call [phone]  for any of the following symptoms:    Following upper endoscopy (EGD, EUS, ERCP, esophageal dilation) Vomiting of blood or coffee ground material  New, significant abdominal pain  New, significant chest pain or pain under the shoulder blades  Painful or persistently difficult swallowing  New shortness of breath  Black, tarry-looking or red, bloody stools  FOLLOW UP:  If any biopsies were taken  you will be contacted by phone or by letter within the next 1-3 weeks. Call [phone]  if you have not heard about the biopsies in 3 weeks.  Please also call with any specific questions about appointments or follow up tests.  Coronavirus (COVID-19) Are you at risk?  Are you at risk for the Coronavirus (COVID-19)?  To be considered high risk for Coronavirus (COVID-19), you have to meet the following criteria:  Traveled to [place], [place], [place], [place] or [place]; or in the [place] to [place], [place], [place], or [place]; and have fever, cough, and shortness of breath within the last 2 weeks of travel or  Been in close contact with a person diagnosed with COVID-19 within the last 2 weeks and have fever, cough, and shortness of breath  If you do not meet these criteria, you are considered low risk for COVID-19.  What to do if you are high risk for COVID-19?   If you are having a medical emergency, call 911.  Seek medical care right away. Before you go to a doctors office, urgent care or emergency department, call ahead and tell them about your recent travel, contact with someone diagnosed with COVID-19, and your symptoms. You should receive instructions from your physicians office regarding next steps of care.   When you arrive at healthcare provider, tell the healthcare staff immediately you have returned from visiting [place], [place], [place], [place] or [place]; or traveled in the [place] to [place], [place], [place], or [place];  in the last two weeks or you have been in close contact with a person diagnosed with COVID-19 in the last 2 weeks.    Tell the health care staff about your symptoms: fever, cough and shortness of breath.  After you have been seen by a medical provider, you will be either: o Tested for (COVID-19) and discharged home on quarantine except to seek medical care if symptoms worsen, and asked to  - Stay home and avoid contact with  others until you get your results (4-5 days)  - Avoid travel on public transportation if possible (such as bus, train, or airplane) or o Sent to the Emergency Department by EMS for evaluation, COVID-19 testing, and possible admission depending on your condition and test results.   What to do if you are LOW RISK for COVID-19?  Reduce your risk of any infection by using the same precautions used for avoiding the common cold or flu:   Wash your hands often with soap and warm water for at least 20 seconds.  If soap and water are not readily available, use an alcohol-based hand sanitizer with at least 60% alcohol.   If coughing or sneezing, cover your mouth and nose by coughing or sneezing into the elbow areas of your shirt or coat, into a tissue or into your sleeve (not your hands).  Avoid shaking hands with others and consider head nods or verbal greetings only.  Avoid touching your eyes, nose, or mouth with unwashed hands.   Avoid close contact with people who are sick.  Avoid places or events with large numbers of people in one location, like concerts or sporting events.  Carefully consider travel plans you have or are making.  If you are planning any travel outside or inside the [place], visit the [facility] webpage for the latest health notices.  If you have some symptoms but not all symptoms, continue to monitor at home and seek medical attention if your symptoms worsen.  If you are having a medical emergency, call 911.   Additional healthcare options for patients  [place] / e-Visit:  [email or web address]           [facility]: [facility]: [facility]: [phone]

## 2018-05-07 NOTE — Anesthesia Procedure Notes (Signed)
Procedure Name: Intubation Date/Time: 05/07/2018 11:17 AM Performed by: Sharlette Dense, CRNA Patient Re-evaluated:Patient Re-evaluated prior to induction Oxygen Delivery Method: Circle system utilized Preoxygenation: Pre-oxygenation with 100% oxygen Induction Type: IV induction and Rapid sequence Laryngoscope Size: Miller and 3 Grade View: Grade I Tube type: Oral Tube size: 7.5 mm Number of attempts: 1 Airway Equipment and Method: Stylet Placement Confirmation: ETT inserted through vocal cords under direct vision,  positive ETCO2 and breath sounds checked- equal and bilateral Secured at: 22 cm Tube secured with: Tape Dental Injury: Teeth and Oropharynx as per pre-operative assessment

## 2018-05-07 NOTE — Transfer of Care (Signed)
Immediate Anesthesia Transfer of Care Note  Patient: Marc Tyler  Procedure(s) Performed: ESOPHAGOGASTRODUODENOSCOPY (EGD) WITH PROPOFOL (N/A )  Patient Location: Endoscopy Unit  Anesthesia Type:General  Level of Consciousness: awake, alert  and oriented  Airway & Oxygen Therapy: Patient Spontanous Breathing and Patient connected to face mask oxygen  Post-op Assessment: Report given to RN and Post -op Vital signs reviewed and stable  Post vital signs: Reviewed and stable  Last Vitals:  Vitals Value Taken Time  BP    Temp    Pulse    Resp    SpO2      Last Pain:  Vitals:   05/07/18 1000  TempSrc: Oral  PainSc: 0-No pain         Complications: No apparent anesthesia complications

## 2018-05-07 NOTE — Consult Note (Signed)
Reason for Consult: GI bleed Referring Physician: Hospital team  Marc Tyler is an 68 y.o. male.  HPI: Patient with 4 days of melena without any other GI symptoms pacifically no swallowing problems heartburn or abdominal pain and he only rarely uses ibuprofen and no aspirin and has been off his blood thinner for some time and he has had 3 colonoscopies with polyps and diverticuli before 1 about 2 years ago he says in [place] and his family history is negative for any GI issues and he did have some black stools about 6 months ago but it was short-lived and he has no other complaints  Past Medical History:  Diagnosis Date  . Hyperlipidemia     Past Surgical History:  Procedure Laterality Date  . HERNIA REPAIR    . Portal vein thrombosis    . TREATMENT FISTULA ANAL      History reviewed. No pertinent family history.  Social History:  reports that he has quit smoking. He has never used smokeless tobacco. He reports current alcohol use. He reports that he does not use drugs.  Allergies: No Known Allergies  Medications: I have reviewed the patient's current medications.  Results for orders placed or performed during the hospital encounter of 05/06/18 (from the past 48 hour(s))  POC occult blood, ED     Status: Abnormal   Collection Time: 05/06/18  2:09 PM  Result Value Ref Range   Fecal Occult Bld POSITIVE (A) NEGATIVE  Type and screen [place]     Status: None   Collection Time: 05/06/18  2:10 PM  Result Value Ref Range   ABO/RH(D) O POS    Antibody Screen NEG    Sample Expiration 05/09/2018    Unit Number [id number]    Blood Component Type RED CELLS,LR    Unit division 00    Status of Unit ISSUED,FINAL    Transfusion Status OK TO TRANSFUSE    Crossmatch Result      Compatible Performed at [facility], [place] [address] [place], [place] [address]   Comprehensive metabolic panel     Status: Abnormal   Collection Time: 05/06/18   2:14 PM  Result Value Ref Range   Sodium 136 135 - 145 mmol/L   Potassium 4.1 3.5 - 5.1 mmol/L   Chloride 106 98 - 111 mmol/L   CO2 20 (L) 22 - 32 mmol/L   Glucose, Bld 210 (H) 70 - 99 mg/dL   BUN 52 (H) 8 - 23 mg/dL   Creatinine, Ser 0.83 0.61 - 1.24 mg/dL   Calcium 8.4 (L) 8.9 - 10.3 mg/dL   Total Protein 5.8 (L) 6.5 - 8.1 g/dL   Albumin 3.4 (L) 3.5 - 5.0 g/dL   AST 36 15 - 41 U/L   ALT 31 0 - 44 U/L   Alkaline Phosphatase 66 38 - 126 U/L   Total Bilirubin 0.5 0.3 - 1.2 mg/dL   GFR calc non Af Amer >60 >60 mL/min   GFR calc Af Amer >60 >60 mL/min   Anion gap 10 5 - 15    Comment: Performed at [facility], [place] [address] [place],  [address]  CBC     Status: Abnormal   Collection Time: 05/06/18  2:14 PM  Result Value Ref Range   WBC 12.2 (H) 4.0 - 10.5 K/uL   RBC 2.55 (L) 4.22 - 5.81 MIL/uL   Hemoglobin 7.6 (L) 13.0 - 17.0 g/dL   HCT 24.4 (L) 39.0 -  52.0 %   MCV 95.7 80.0 - 100.0 fL   MCH 29.8 26.0 - 34.0 pg   MCHC 31.1 30.0 - 36.0 g/dL   RDW 14.3 11.5 - 15.5 %   Platelets 110 (L) 150 - 400 K/uL    Comment: REPEATED TO VERIFY PLATELET COUNT CONFIRMED BY SMEAR SPECIMEN CHECKED FOR CLOTS Immature Platelet Fraction may be clinically indicated, consider ordering this additional test [id number]    nRBC 0.0 0.0 - 0.2 %    Comment: Performed at [facility], [place] [address] [place], [place] [address]  Prepare RBC     Status: None   Collection Time: 05/06/18  3:14 PM  Result Value Ref Range   Order Confirmation      ORDER PROCESSED BY BLOOD BANK Performed at [facility] [address] [place], [place] [address]   Glucose, capillary     Status: Abnormal   Collection Time: 05/06/18  5:53 PM  Result Value Ref Range   Glucose-Capillary 103 (H) 70 - 99 mg/dL  Glucose, capillary     Status: Abnormal   Collection Time: 05/06/18  9:35 PM  Result Value Ref Range   Glucose-Capillary 106 (H) 70 - 99 mg/dL   Hemoglobin A1c     Status: None   Collection Time: 05/07/18  3:03 AM  Result Value Ref Range   Hgb A1c MFr Bld 5.5 4.8 - 5.6 %    Comment: (NOTE) Pre diabetes:          5.7%-6.4% Diabetes:              >6.4% Glycemic control for   <7.0% adults with diabetes    Mean Plasma Glucose 111.15 mg/dL    Comment: Performed at [facility], [place] [address], [place], [place] [address]  CBC     Status: Abnormal   Collection Time: 05/07/18  3:03 AM  Result Value Ref Range   WBC 6.4 4.0 - 10.5 K/uL   RBC 2.38 (L) 4.22 - 5.81 MIL/uL   Hemoglobin 7.2 (L) 13.0 - 17.0 g/dL   HCT 22.7 (L) 39.0 - 52.0 %   MCV 95.4 80.0 - 100.0 fL   MCH 30.3 26.0 - 34.0 pg   MCHC 31.7 30.0 - 36.0 g/dL   RDW 15.7 (H) 11.5 - 15.5 %   Platelets 76 (L) 150 - 400 K/uL    Comment: REPEATED TO VERIFY Immature Platelet Fraction may be clinically indicated, consider ordering this additional test [id number] CONSISTENT WITH PREVIOUS RESULT    nRBC 0.0 0.0 - 0.2 %    Comment: Performed at [facility], [place] [address] [place], [place] [address]  Glucose, capillary     Status: Abnormal   Collection Time: 05/07/18  7:28 AM  Result Value Ref Range   Glucose-Capillary 101 (H) 70 - 99 mg/dL    No results found.  ROS negative except above Blood pressure (!) 142/47, pulse 79, temperature 97.6 F (36.4 C), temperature source Oral, resp. rate 14, height 5\' 10"  (1.778 m), weight 87.2 kg, SpO2 100 %. Physical Exam vital signs stable afebrile no acute distress exam please see preassessment evaluation hemoglobin decreased increased BUN other labs okay platelet counts a little low Assessment/Plan: GI bleeding Plan: The risks benefits methods of endoscopy was discussed with the patient and we compared to the colonoscopy he has had in the past and will proceed today with further work-up and plans pending those findings  [place] E 05/07/2018, 10:54 AM

## 2018-05-08 DIAGNOSIS — D62 Acute posthemorrhagic anemia: Secondary | ICD-10-CM

## 2018-05-08 LAB — COMPREHENSIVE METABOLIC PANEL
ALT: 23 U/L (ref 0–44)
AST: 29 U/L (ref 15–41)
Albumin: 2.8 g/dL — ABNORMAL LOW (ref 3.5–5.0)
Alkaline Phosphatase: 50 U/L (ref 38–126)
Anion gap: 5 (ref 5–15)
BUN: 22 mg/dL (ref 8–23)
CO2: 23 mmol/L (ref 22–32)
Calcium: 7.8 mg/dL — ABNORMAL LOW (ref 8.9–10.3)
Chloride: 113 mmol/L — ABNORMAL HIGH (ref 98–111)
Creatinine, Ser: 0.85 mg/dL (ref 0.61–1.24)
GFR calc Af Amer: >60 mL/min (ref >60)
GFR calc non Af Amer: >60 mL/min (ref >60)
Glucose, Bld: 97 mg/dL (ref 70–99)
Potassium: 3.9 mmol/L (ref 3.5–5.1)
Sodium: 141 mmol/L (ref 135–145)
Total Bilirubin: 0.6 mg/dL (ref 0.3–1.2)
Total Protein: 4.7 g/dL — ABNORMAL LOW (ref 6.5–8.1)

## 2018-05-08 LAB — CBC WITH DIFFERENTIAL/PLATELET
Abs Immature Granulocytes: 0.01 10*3/uL (ref 0.00–0.07)
Basophils Absolute: 0 10*3/uL (ref 0.0–0.1)
Basophils Relative: 0 %
Eosinophils Absolute: 0.1 10*3/uL (ref 0.0–0.5)
Eosinophils Relative: 2 %
HCT: 19.5 % — ABNORMAL LOW (ref 39.0–52.0)
Hemoglobin: 6.1 g/dL — CL (ref 13.0–17.0)
Immature Granulocytes: 0 %
Lymphocytes Relative: 24 %
Lymphs Abs: 0.8 10*3/uL (ref 0.7–4.0)
MCH: 29.9 pg (ref 26.0–34.0)
MCHC: 31.3 g/dL (ref 30.0–36.0)
MCV: 95.6 fL (ref 80.0–100.0)
Monocytes Absolute: 0.3 10*3/uL (ref 0.1–1.0)
Monocytes Relative: 9 %
Neutro Abs: 2.2 10*3/uL (ref 1.7–7.7)
Neutrophils Relative %: 65 %
Platelets: 66 10*3/uL — ABNORMAL LOW (ref 150–400)
RBC: 2.04 MIL/uL — ABNORMAL LOW (ref 4.22–5.81)
RDW: 15.6 % — ABNORMAL HIGH (ref 11.5–15.5)
WBC: 3.4 10*3/uL — ABNORMAL LOW (ref 4.0–10.5)
nRBC: 0 % (ref 0.0–0.2)

## 2018-05-08 LAB — PROTIME-INR
INR: 1.2 (ref 0.8–1.2)
Prothrombin Time: 15.3 seconds — ABNORMAL HIGH (ref 11.4–15.2)

## 2018-05-08 LAB — PREPARE RBC (CROSSMATCH)

## 2018-05-08 LAB — HEMOGLOBIN AND HEMATOCRIT, BLOOD
HCT: 26 % — ABNORMAL LOW (ref 39.0–52.0)
Hemoglobin: 8.2 g/dL — ABNORMAL LOW (ref 13.0–17.0)

## 2018-05-08 LAB — GLUCOSE, CAPILLARY
Glucose-Capillary: 101 mg/dL — ABNORMAL HIGH (ref 70–99)
Glucose-Capillary: 147 mg/dL — ABNORMAL HIGH (ref 70–99)

## 2018-05-08 MED ORDER — SODIUM CHLORIDE 0.9% IV SOLUTION
Freq: Once | INTRAVENOUS | Status: AC
  Administered 2018-05-08: 05:00:00 via INTRAVENOUS

## 2018-05-08 MED ORDER — PANTOPRAZOLE SODIUM 40 MG PO TBEC: 40.0000 mg | DELAYED_RELEASE_TABLET | Freq: Every day | ORAL | 0 refills | Status: DC

## 2018-05-08 NOTE — Progress Notes (Signed)
GI Discharge/Sign-Off Note  Subjective: Without further bleeding.   Active Problems:   GI bleed   Results for orders placed or performed during the hospital encounter of 05/06/18 (from the past 72 hour(s))  POC occult blood, ED     Status: Abnormal   Collection Time: 05/06/18  2:09 PM  Result Value Ref Range   Fecal Occult Bld POSITIVE (A) NEGATIVE  Type and screen [place]     Status: None (Preliminary result)   Collection Time: 05/06/18  2:10 PM  Result Value Ref Range   ABO/RH(D) O POS    Antibody Screen NEG    Sample Expiration 05/09/2018    Unit Number [id number]    Blood Component Type RED CELLS,LR    Unit division 00    Status of Unit ISSUED,FINAL    Transfusion Status OK TO TRANSFUSE    Crossmatch Result Compatible    Unit Number [id number]    Blood Component Type RED CELLS,LR    Unit division 00    Status of Unit ISSUED    Transfusion Status OK TO TRANSFUSE    Crossmatch Result      Compatible Performed at [facility], [place] [address], [place], [place] [address]   Comprehensive metabolic panel     Status: Abnormal   Collection Time: 05/06/18  2:14 PM  Result Value Ref Range   Sodium 136 135 - 145 mmol/L   Potassium 4.1 3.5 - 5.1 mmol/L   Chloride 106 98 - 111 mmol/L   CO2 20 (L) 22 - 32 mmol/L   Glucose, Bld 210 (H) 70 - 99 mg/dL   BUN 52 (H) 8 - 23 mg/dL   Creatinine, Ser 0.83 0.61 - 1.24 mg/dL   Calcium 8.4 (L) 8.9 - 10.3 mg/dL   Total Protein 5.8 (L) 6.5 - 8.1 g/dL   Albumin 3.4 (L) 3.5 - 5.0 g/dL   AST 36 15 - 41 U/L   ALT 31 0 - 44 U/L   Alkaline Phosphatase 66 38 - 126 U/L   Total Bilirubin 0.5 0.3 - 1.2 mg/dL   GFR calc non Af Amer >60 >60 mL/min   GFR calc Af Amer >60 >60 mL/min   Anion gap 10 5 - 15    Comment: Performed at [facility], [place] [address] [place], [place] [address]  CBC     Status: Abnormal   Collection Time: 05/06/18  2:14 PM  Result Value Ref Range   WBC 12.2  (H) 4.0 - 10.5 K/uL   RBC 2.55 (L) 4.22 - 5.81 MIL/uL   Hemoglobin 7.6 (L) 13.0 - 17.0 g/dL   HCT 24.4 (L) 39.0 - 52.0 %   MCV 95.7 80.0 - 100.0 fL   MCH 29.8 26.0 - 34.0 pg   MCHC 31.1 30.0 - 36.0 g/dL   RDW 14.3 11.5 - 15.5 %   Platelets 110 (L) 150 - 400 K/uL    Comment: REPEATED TO VERIFY PLATELET COUNT CONFIRMED BY SMEAR SPECIMEN CHECKED FOR CLOTS Immature Platelet Fraction may be clinically indicated, consider ordering this additional test [id number]    nRBC 0.0 0.0 - 0.2 %    Comment: Performed at [facility], [place] [address], [place], [place] [address]  Prepare RBC     Status: None   Collection Time: 05/06/18  3:14 PM  Result Value Ref Range   Order Confirmation      ORDER PROCESSED BY BLOOD BANK Performed at [facility],  [place] [address], [place], [place] [address]   Glucose, capillary     Status: Abnormal   Collection Time: 05/06/18  5:53 PM  Result Value Ref Range   Glucose-Capillary 103 (H) 70 - 99 mg/dL  Glucose, capillary     Status: Abnormal   Collection Time: 05/06/18  9:35 PM  Result Value Ref Range   Glucose-Capillary 106 (H) 70 - 99 mg/dL  Hemoglobin A1c     Status: None   Collection Time: 05/07/18  3:03 AM  Result Value Ref Range   Hgb A1c MFr Bld 5.5 4.8 - 5.6 %    Comment: (NOTE) Pre diabetes:          5.7%-6.4% Diabetes:              >6.4% Glycemic control for   <7.0% adults with diabetes    Mean Plasma Glucose 111.15 mg/dL    Comment: Performed at [facility], [place] [address] [place], [place] [address]  CBC     Status: Abnormal   Collection Time: 05/07/18  3:03 AM  Result Value Ref Range   WBC 6.4 4.0 - 10.5 K/uL   RBC 2.38 (L) 4.22 - 5.81 MIL/uL   Hemoglobin 7.2 (L) 13.0 - 17.0 g/dL   HCT 22.7 (L) 39.0 - 52.0 %   MCV 95.4 80.0 - 100.0 fL   MCH 30.3 26.0 - 34.0 pg   MCHC 31.7 30.0 - 36.0 g/dL   RDW 15.7 (H) 11.5 - 15.5 %   Platelets 76 (L) 150 - 400 K/uL    Comment: REPEATED TO VERIFY Immature  Platelet Fraction may be clinically indicated, consider ordering this additional test [id number] CONSISTENT WITH PREVIOUS RESULT    nRBC 0.0 0.0 - 0.2 %    Comment: Performed at [facility], [place] [address] [place], [place] [address]  Glucose, capillary     Status: Abnormal   Collection Time: 05/07/18  7:28 AM  Result Value Ref Range   Glucose-Capillary 101 (H) 70 - 99 mg/dL  Glucose, capillary     Status: Abnormal   Collection Time: 05/07/18 12:29 PM  Result Value Ref Range   Glucose-Capillary 110 (H) 70 - 99 mg/dL  Glucose, capillary     Status: None   Collection Time: 05/07/18  4:40 PM  Result Value Ref Range   Glucose-Capillary 91 70 - 99 mg/dL  Glucose, capillary     Status: None   Collection Time: 05/07/18 11:32 PM  Result Value Ref Range   Glucose-Capillary 85 70 - 99 mg/dL  CBC with Differential/Platelet     Status: Abnormal   Collection Time: 05/08/18  3:07 AM  Result Value Ref Range   WBC 3.4 (L) 4.0 - 10.5 K/uL   RBC 2.04 (L) 4.22 - 5.81 MIL/uL   Hemoglobin 6.1 (LL) 13.0 - 17.0 g/dL    Comment: REPEATED TO VERIFY THIS CRITICAL RESULT HAS VERIFIED AND BEEN CALLED TO J ATKINS RN BY AMELIA NAVARRO ON 04 07 2020 AT 0337, AND HAS BEEN READ BACK.     HCT 19.5 (L) 39.0 - 52.0 %   MCV 95.6 80.0 - 100.0 fL   MCH 29.9 26.0 - 34.0 pg   MCHC 31.3 30.0 - 36.0 g/dL   RDW 15.6 (H) 11.5 - 15.5 %   Platelets 66 (L) 150 - 400 K/uL    Comment: Immature Platelet Fraction may be clinically indicated, consider ordering this additional test [id number] CONSISTENT WITH PREVIOUS RESULT    nRBC 0.0  0.0 - 0.2 %   Neutrophils Relative % 65 %   Neutro Abs 2.2 1.7 - 7.7 K/uL   Lymphocytes Relative 24 %   Lymphs Abs 0.8 0.7 - 4.0 K/uL   Monocytes Relative 9 %   Monocytes Absolute 0.3 0.1 - 1.0 K/uL   Eosinophils Relative 2 %   Eosinophils Absolute 0.1 0.0 - 0.5 K/uL   Basophils Relative 0 %   Basophils Absolute 0.0 0.0 - 0.1 K/uL   Immature Granulocytes 0 %   Abs  Immature Granulocytes 0.01 0.00 - 0.07 K/uL    Comment: Performed at [facility], [place] [address], [place], [place] [address]  Protime-INR     Status: Abnormal   Collection Time: 05/08/18  3:07 AM  Result Value Ref Range   Prothrombin Time 15.3 (H) 11.4 - 15.2 seconds   INR 1.2 0.8 - 1.2    Comment: (NOTE) INR goal varies based on device and disease states. Performed at [facility], [place] [address], [place], [place] [address]   Comprehensive metabolic panel     Status: Abnormal   Collection Time: 05/08/18  3:07 AM  Result Value Ref Range   Sodium 141 135 - 145 mmol/L   Potassium 3.9 3.5 - 5.1 mmol/L   Chloride 113 (H) 98 - 111 mmol/L   CO2 23 22 - 32 mmol/L   Glucose, Bld 97 70 - 99 mg/dL   BUN 22 8 - 23 mg/dL   Creatinine, Ser 0.85 0.61 - 1.24 mg/dL   Calcium 7.8 (L) 8.9 - 10.3 mg/dL   Total Protein 4.7 (L) 6.5 - 8.1 g/dL   Albumin 2.8 (L) 3.5 - 5.0 g/dL   AST 29 15 - 41 U/L   ALT 23 0 - 44 U/L   Alkaline Phosphatase 50 38 - 126 U/L   Total Bilirubin 0.6 0.3 - 1.2 mg/dL   GFR calc non Af Amer >60 >60 mL/min   GFR calc Af Amer >60 >60 mL/min   Anion gap 5 5 - 15    Comment: Performed at [facility], [place] [address], [place], [place] [address]  Prepare RBC     Status: None   Collection Time: 05/08/18  3:46 AM  Result Value Ref Range   Order Confirmation      ORDER PROCESSED BY BLOOD BANK Performed at [facility], [place] [address] [place], [place] [address]   Glucose, capillary     Status: Abnormal   Collection Time: 05/08/18  7:46 AM  Result Value Ref Range   Glucose-Capillary 101 (H) 70 - 99 mg/dL  Hemoglobin and hematocrit, blood     Status: Abnormal   Collection Time: 05/08/18 10:22 AM  Result Value Ref Range   Hemoglobin 8.2 (L) 13.0 - 17.0 g/dL    Comment: REPEATED TO VERIFY POST TRANSFUSION SPECIMEN DELTA CHECK NOTED    HCT 26.0 (L) 39.0 - 52.0 %    Comment: Performed at [facility], [place] [address], [place], [place] [address]  Glucose, capillary     Status: Abnormal   Collection Time: 05/08/18 10:52 AM  Result Value Ref Range   Glucose-Capillary 147 (H) 70 - 99 mg/dL    Ct Abdomen Pelvis W Contrast  Result Date: 05/07/2018 CLINICAL DATA:  Melena for 4 days. History of portal vein thrombosis. EXAM: CT ABDOMEN AND PELVIS WITH CONTRAST TECHNIQUE: Multidetector CT imaging of the abdomen and pelvis was performed using the standard protocol following bolus  administration of intravenous contrast. CONTRAST:  180mL OMNIPAQUE IOHEXOL 300 MG/ML  SOLN COMPARISON:  CT 10/30/2014 and 08/29/2014. FINDINGS: Lower chest: Evaluation of the lung bases is mildly limited by breathing artifact. There are mild emphysematous changes and a small hiatal hernia. No significant pleural or pericardial effusion. Hepatobiliary: There is progressive atrophy within the left hepatic lobe related to chronic portal vein thrombosis. The liver demonstrates mild contour irregularity and irregular peripheral enhancement in the right lobe, but no focal abnormality. The main portal vein is patent. Portal vein collaterals are less prominent. No evidence of gallstones, gallbladder wall thickening or biliary dilatation. Pancreas: Unremarkable. No pancreatic ductal dilatation or surrounding inflammatory changes. Spleen: Measures 12.5 x 7.4 x 13.1 cm (volume = 630 cm^3) consistent with mild splenomegaly. No focal abnormality. Adrenals/Urinary Tract: Both adrenal glands appear normal. There are small nonobstructing bilateral renal calculi. No evidence of ureteral calculus or hydronephrosis. There is a stable small cyst anteriorly in the lower pole of the left kidney. The bladder appears unremarkable. Stomach/Bowel: The stomach, small bowel and appendix appear normal. There are diverticular changes throughout the colon. There is wall thickening in the sigmoid colon with soft tissue stranding in the surrounding fat  suspicious for mild diverticulitis. No active GI bleeding identified. No evidence of bowel perforation, obstruction or abscess. Vascular/Lymphatic: There are no enlarged abdominal or pelvic lymph nodes. No acute vascular findings. There is mild aortic and branch vessel atherosclerosis. As above, previously demonstrated portal vein collaterals are less prominent. The main portal vein, superior mesenteric and splenic veins are patent. Reproductive: Mild enlargement of the prostate gland. The seminal vesicles appear normal. Other: Mild soft tissue stranding throughout the mesenteric and retroperitoneal fat without focal fluid collection. Musculoskeletal: No acute or significant osseous findings. IMPRESSION: 1. Diverticular changes throughout the colon with wall thickening and surrounding soft tissue stranding in the sigmoid colon suspicious for mild diverticulitis. No active GI bleeding identified. 2. Sequela of prior portal vein thrombosis with chronic atrophy in the left hepatic lobe, contour irregularity of the liver and interval decreased prominence of portal vein collaterals. No acute vascular findings. 3. Mild splenomegaly. 4. Nonobstructing bilateral renal calculi. 5.  Aortic Atherosclerosis (ICD10-I70.0). Electronically Signed   By: Richardean Sale M.D.   On: 05/07/2018 16:58    @MEDSSCHEDLED @  GI DISCHARGE PLANNING:  Diet:  regular  Anticoagulation/Antiplatelet Rx Suggestions: none, no NSAIDS  GI Medications: PPI of choice  Labs/Procedures Ordered: See PCP next week to check CBC  GI FOLLOW UP:  Call [phone] to make appointment.   Doctor: Watt Climes pt preference  Time:  1 month   Laurence Spates, MD   Pager [phone] If no answer or after hours call [phone]

## 2018-05-08 NOTE — Progress Notes (Signed)
Patient given AVS, all questions answered, no further questions. Patient taken down by CNA to front exit.    Norlene Duel RN,  BSN

## 2018-05-08 NOTE — Progress Notes (Signed)
Attending notified of recent lab results: Hgb: 8.2 HCT 26 No new orders obtained.   Norlene Duel RN, BSN 05/08/18

## 2018-05-08 NOTE — Progress Notes (Signed)
CRITICAL VALUE ALERT  Critical Value:  Hg 6.1  Date & Time Notied:  05/08/2018 at 03:42am  Provider Notified: K.Schorr  Orders Received/Actions taken: waiting for further orders.

## 2018-05-08 NOTE — Plan of Care (Signed)
  Problem: Clinical Measurements: Goal: Ability to maintain clinical measurements within normal limits will improve Outcome: Progressing   Problem: Activity: Goal: Risk for activity intolerance will decrease Outcome: Progressing   Problem: Nutrition: Goal: Adequate nutrition will be maintained Outcome: Progressing   Problem: Coping: Goal: Level of anxiety will decrease Outcome: Progressing   Problem: Pain Managment: Goal: General experience of comfort will improve Outcome: Progressing   

## 2018-05-08 NOTE — Discharge Summary (Signed)
Physician Discharge Summary  Patient ID: Marc Tyler MRN: [id number] DOB/AGE: 66-Jan-1952 66 y.o.  Admit date: 05/06/2018 Discharge date: 05/08/2018  Admission Diagnoses:  Discharge Diagnoses:  Active Problems:   GI bleed Grade 1 esophageal varices Portal hypertensive gastropathy Duodenal erosion  Discharged Condition: stable  Hospital Course: Marc Tyler a 68 year old male with past medical history significant for prior liver abscess and portal vein thrombosis not currently on oral anticoagulation.  Patient presented with 3-day history of black tarry stool.  On further questioning, patient admitted use of NSAIDs (ibuprofen).  Patient endorsed associated fatigue and dyspnea on exertion.  On presentation to the hospital, patient's hemoglobin was 7.6 g/dL (down from 9.3 g/dL).  Patient was admitted for further assessment and management.  Patient was managed supportively.  Patient was managed with PPI.  GI team was consulted, and the patient underwent EGD.  EGD revealed grade 1 esophageal varices, portal hypertensive gastropathy and duodenal erosion.  Last hemoglobin checked was 7.2 g/dL (down from 7.6 g/dL).  Hemoglobin has remained stable overnight.  Patient is back to his normal self.  GI team is cleared patient for discharge.  Patient will follow with GI and PCP within 1 week of discharge.  GI bleed, likely upper GI bleed:   Patient presented with drop in hemoglobin, black tarry stool with positive FOBT Hemoglobin on presentation was 7.6 with baseline hemoglobin of 9.3g/dl Hemoglobin to discharge was 8.2 g/dL.     EGD findings as documented above.   Patient has been advised to quit NSAID use.  Acute blood loss anemia, symptomatic:  Generalized weakness with positive orthostatic vital signs Patient was managed supportively.   Patient was adequately hydrated.   Patient was transfused with packed red blood cells. H/H was monitored during the hospital stay.    Thrombocytopenia Platelet 110Kon presentation, and down to 70 6K today. Possible on diagnoses chronic liver disease/cirrhosis Check INR in a.m. History of portal vein thrombosis noted, and patient has grade 1 esophageal varices as per EGD.  Leukocytosis,possibly reactive Resolved.  Type 2 diabetes with hyperglycemia Presented with chemistry blood sugar 210 Hemoglobin A1c is 5.5%.   Continue to monitor closely on discharge.  History of hepatic vein thrombosis Previously on Xarelto was discontinued prior to admission.    Hyperlipidemia Resume Crestor  Consults: GI  Significant Diagnostic Studies:  Patient underwent EGD.  EGD revealed grade 1 esophageal varices, portal hypertensive gastropathy and duodenal erosion.   Discharge Exam: Blood pressure 134/70, pulse 72, temperature 98.1 F (36.7 C), temperature source Oral, resp. rate 16, height 5\' 10"  (1.778 m), weight 87.2 kg, SpO2 100 %.  Disposition: Discharge disposition: 01-Home or Self Care  Discharge Instructions    Diet - low sodium heart healthy   Complete by:  As directed    Increase activity slowly   Complete by:  As directed      Allergies as of 05/08/2018   No Known Allergies     Medication List    STOP taking these medications   ibuprofen 200 MG tablet Commonly known as:  ADVIL,MOTRIN   Rivaroxaban 15 & 20 MG Tbpk Commonly known as:  Xarelto Starter Pack     TAKE these medications   Melatonin 10 MG Caps Take 10 mg by mouth at bedtime as needed (sleep).   multivitamin with minerals tablet Take 1 tablet by mouth daily. What changed:  Another medication with the same name was removed. Continue taking this medication, and follow the directions you see here.   pantoprazole 40 MG  tablet Commonly known as:  PROTONIX Take 1 tablet (40 mg total) by mouth daily. Start taking on:  May 09, 2018   rosuvastatin 20 MG tablet Commonly known as:  CRESTOR Take 20 mg by mouth daily.         SignedBonnell Public 05/08/2018, 12:49 PM

## 2018-05-09 LAB — TYPE AND SCREEN
ABO/RH(D): O POS
Antibody Screen: NEGATIVE
Unit division: 0
Unit division: 0

## 2018-05-09 LAB — BPAM RBC
Blood Product Expiration Date: 202004202359
Blood Product Expiration Date: 202004212359
ISSUE DATE / TIME: 202004051936
ISSUE DATE / TIME: 202004070437
Unit Type and Rh: 5100
Unit Type and Rh: 5100

## 2018-05-15 DIAGNOSIS — Z09 Encounter for follow-up examination after completed treatment for conditions other than malignant neoplasm: Secondary | ICD-10-CM | POA: Diagnosis not present

## 2018-05-15 DIAGNOSIS — D649 Anemia, unspecified: Secondary | ICD-10-CM | POA: Diagnosis not present

## 2018-05-15 DIAGNOSIS — D696 Thrombocytopenia, unspecified: Secondary | ICD-10-CM | POA: Diagnosis not present

## 2018-05-15 DIAGNOSIS — K922 Gastrointestinal hemorrhage, unspecified: Secondary | ICD-10-CM | POA: Diagnosis not present

## 2018-05-23 DIAGNOSIS — K922 Gastrointestinal hemorrhage, unspecified: Secondary | ICD-10-CM | POA: Diagnosis not present

## 2018-05-25 DIAGNOSIS — K921 Melena: Secondary | ICD-10-CM | POA: Diagnosis not present

## 2018-05-25 DIAGNOSIS — D62 Acute posthemorrhagic anemia: Secondary | ICD-10-CM | POA: Diagnosis not present

## 2018-05-25 DIAGNOSIS — R932 Abnormal findings on diagnostic imaging of liver and biliary tract: Secondary | ICD-10-CM | POA: Diagnosis not present

## 2018-07-09 DIAGNOSIS — K921 Melena: Secondary | ICD-10-CM | POA: Diagnosis not present

## 2018-07-09 DIAGNOSIS — D500 Iron deficiency anemia secondary to blood loss (chronic): Secondary | ICD-10-CM | POA: Diagnosis not present

## 2018-07-09 DIAGNOSIS — R932 Abnormal findings on diagnostic imaging of liver and biliary tract: Secondary | ICD-10-CM | POA: Diagnosis not present

## 2018-07-12 DIAGNOSIS — D500 Iron deficiency anemia secondary to blood loss (chronic): Secondary | ICD-10-CM | POA: Diagnosis not present

## 2018-09-03 DIAGNOSIS — D3132 Benign neoplasm of left choroid: Secondary | ICD-10-CM | POA: Diagnosis not present

## 2018-10-03 DIAGNOSIS — Z131 Encounter for screening for diabetes mellitus: Secondary | ICD-10-CM | POA: Diagnosis not present

## 2018-10-03 DIAGNOSIS — Z23 Encounter for immunization: Secondary | ICD-10-CM | POA: Diagnosis not present

## 2018-10-03 DIAGNOSIS — D696 Thrombocytopenia, unspecified: Secondary | ICD-10-CM | POA: Diagnosis not present

## 2018-10-03 DIAGNOSIS — Z125 Encounter for screening for malignant neoplasm of prostate: Secondary | ICD-10-CM | POA: Diagnosis not present

## 2018-10-03 DIAGNOSIS — D500 Iron deficiency anemia secondary to blood loss (chronic): Secondary | ICD-10-CM | POA: Diagnosis not present

## 2018-10-03 DIAGNOSIS — E7800 Pure hypercholesterolemia, unspecified: Secondary | ICD-10-CM | POA: Diagnosis not present

## 2018-10-03 DIAGNOSIS — Z0000 Encounter for general adult medical examination without abnormal findings: Secondary | ICD-10-CM | POA: Diagnosis not present

## 2019-03-31 ENCOUNTER — Ambulatory Visit: Payer: Medicare Other | Attending: Internal Medicine

## 2019-03-31 DIAGNOSIS — Z23 Encounter for immunization: Secondary | ICD-10-CM

## 2019-03-31 NOTE — Progress Notes (Signed)
   Covid-19 Vaccination Clinic  Name:  Marc Tyler    MRN: [id number] DOB: 1950-06-05  03/31/2019  [name] was observed post Covid-19 immunization for 15 minutes without incidence. He was provided with Vaccine Information Sheet and instruction to access the V-Safe system.   [name] was instructed to call 911 with any severe reactions post vaccine: Marland Kitchen Difficulty breathing  . Swelling of your face and throat  . A fast heartbeat  . A bad rash all over your body  . Dizziness and weakness    Immunizations Administered    Name Date Dose VIS Date Route   Pfizer COVID-19 Vaccine 03/31/2019 12:18 PM 0.3 mL 01/11/2019 Intramuscular   Manufacturer: [place]   Lot: [id number]   [place]: [id number]

## 2019-04-16 DIAGNOSIS — R0981 Nasal congestion: Secondary | ICD-10-CM | POA: Diagnosis not present

## 2019-04-16 DIAGNOSIS — R0602 Shortness of breath: Secondary | ICD-10-CM | POA: Diagnosis not present

## 2019-04-16 DIAGNOSIS — R195 Other fecal abnormalities: Secondary | ICD-10-CM | POA: Diagnosis not present

## 2019-04-17 DIAGNOSIS — R195 Other fecal abnormalities: Secondary | ICD-10-CM | POA: Diagnosis not present

## 2019-04-17 DIAGNOSIS — Z8719 Personal history of other diseases of the digestive system: Secondary | ICD-10-CM | POA: Diagnosis not present

## 2019-04-17 DIAGNOSIS — R011 Cardiac murmur, unspecified: Secondary | ICD-10-CM | POA: Diagnosis not present

## 2019-04-17 DIAGNOSIS — R0600 Dyspnea, unspecified: Secondary | ICD-10-CM | POA: Diagnosis not present

## 2019-04-29 ENCOUNTER — Ambulatory Visit: Payer: Medicare Other | Attending: Internal Medicine

## 2019-04-29 DIAGNOSIS — Z23 Encounter for immunization: Secondary | ICD-10-CM

## 2019-04-29 NOTE — Progress Notes (Signed)
   Covid-19 Vaccination Clinic  Name:  Marc Tyler    MRN: [id number] DOB: 1950/12/29  04/29/2019  [name] was observed post Covid-19 immunization for 15 minutes without incident. He was provided with Vaccine Information Sheet and instruction to access the V-Safe system.   [name] was instructed to call 911 with any severe reactions post vaccine: Marland Kitchen Difficulty breathing  . Swelling of face and throat  . A fast heartbeat  . A bad rash all over body  . Dizziness and weakness   Immunizations Administered    Name Date Dose VIS Date Route   Pfizer COVID-19 Vaccine 04/29/2019 10:45 AM 0.3 mL 01/11/2019 Intramuscular   Manufacturer: [place]   Lot: [id number]   [place]: [place] [facility]  Name:  Marc Tyler    MRN: [id number] DOB: 09/12/1950  04/29/2019  [name] was observed post Covid-19 immunization for 15 minutes without incident. He was provided with Vaccine Information Sheet and instruction to access the V-Safe system.   [name] was instructed to call 911 with any severe reactions post vaccine: Marland Kitchen Difficulty breathing  . Swelling of face and throat  . A fast heartbeat  . A bad rash all over body  . Dizziness and weakness   Immunizations Administered    Name Date Dose VIS Date Route   Pfizer COVID-19 Vaccine 04/29/2019 10:45 AM 0.3 mL 01/11/2019 Intramuscular   Manufacturer: [place]   Lot: [id number]   [place]: [id number]

## 2019-05-06 DIAGNOSIS — Z87898 Personal history of other specified conditions: Secondary | ICD-10-CM | POA: Diagnosis not present

## 2019-05-06 DIAGNOSIS — K922 Gastrointestinal hemorrhage, unspecified: Secondary | ICD-10-CM | POA: Diagnosis not present

## 2019-05-06 DIAGNOSIS — D649 Anemia, unspecified: Secondary | ICD-10-CM | POA: Diagnosis not present

## 2019-10-08 DIAGNOSIS — Z79899 Other long term (current) drug therapy: Secondary | ICD-10-CM | POA: Diagnosis not present

## 2019-10-08 DIAGNOSIS — D649 Anemia, unspecified: Secondary | ICD-10-CM | POA: Diagnosis not present

## 2019-10-08 DIAGNOSIS — Z23 Encounter for immunization: Secondary | ICD-10-CM | POA: Diagnosis not present

## 2019-10-08 DIAGNOSIS — Z125 Encounter for screening for malignant neoplasm of prostate: Secondary | ICD-10-CM | POA: Diagnosis not present

## 2019-10-08 DIAGNOSIS — Z0000 Encounter for general adult medical examination without abnormal findings: Secondary | ICD-10-CM | POA: Diagnosis not present

## 2019-10-08 DIAGNOSIS — E7800 Pure hypercholesterolemia, unspecified: Secondary | ICD-10-CM | POA: Diagnosis not present

## 2019-10-31 DIAGNOSIS — L821 Other seborrheic keratosis: Secondary | ICD-10-CM | POA: Diagnosis not present

## 2019-10-31 DIAGNOSIS — L814 Other melanin hyperpigmentation: Secondary | ICD-10-CM | POA: Diagnosis not present

## 2019-10-31 DIAGNOSIS — L819 Disorder of pigmentation, unspecified: Secondary | ICD-10-CM | POA: Diagnosis not present

## 2019-10-31 DIAGNOSIS — D229 Melanocytic nevi, unspecified: Secondary | ICD-10-CM | POA: Diagnosis not present

## 2019-10-31 DIAGNOSIS — L570 Actinic keratosis: Secondary | ICD-10-CM | POA: Diagnosis not present

## 2019-11-15 DIAGNOSIS — Z23 Encounter for immunization: Secondary | ICD-10-CM | POA: Diagnosis not present

## 2019-12-25 DIAGNOSIS — R030 Elevated blood-pressure reading, without diagnosis of hypertension: Secondary | ICD-10-CM | POA: Diagnosis not present

## 2019-12-25 DIAGNOSIS — K4090 Unilateral inguinal hernia, without obstruction or gangrene, not specified as recurrent: Secondary | ICD-10-CM | POA: Diagnosis not present

## 2020-01-29 DIAGNOSIS — K4090 Unilateral inguinal hernia, without obstruction or gangrene, not specified as recurrent: Secondary | ICD-10-CM | POA: Diagnosis not present

## 2020-03-03 DIAGNOSIS — Z01818 Encounter for other preprocedural examination: Secondary | ICD-10-CM | POA: Diagnosis not present

## 2020-03-10 ENCOUNTER — Other Ambulatory Visit (HOSPITAL_COMMUNITY)
Admission: RE | Admit: 2020-03-10 | Discharge: 2020-03-10 | Disposition: A | Discharge status: Home / Self Care | Payer: Medicare HMO | Source: Ambulatory Visit | Attending: Surgery | Admitting: Surgery

## 2020-03-10 DIAGNOSIS — Z20822 Contact with and (suspected) exposure to covid-19: Secondary | ICD-10-CM | POA: Diagnosis not present

## 2020-03-10 DIAGNOSIS — Z01812 Encounter for preprocedural laboratory examination: Secondary | ICD-10-CM | POA: Diagnosis not present

## 2020-03-10 LAB — SARS CORONAVIRUS 2 (TAT 6-24 HRS): SARS Coronavirus 2: NEGATIVE

## 2020-03-11 ENCOUNTER — Encounter (HOSPITAL_BASED_OUTPATIENT_CLINIC_OR_DEPARTMENT_OTHER): Payer: Self-pay | Admitting: Surgery

## 2020-03-11 ENCOUNTER — Other Ambulatory Visit: Payer: Self-pay

## 2020-03-11 ENCOUNTER — Ambulatory Visit: Payer: Self-pay | Admitting: Surgery

## 2020-03-11 NOTE — Progress Notes (Signed)
Spoke w/ via phone for pre-op interview--- PT Lab needs dos----  No (per anes)/  Pre-op orders pending             Lab results------ no COVID test ------ done 03-10-2020 negative result in epic Arrive at ------- 0530 on 03-13-2020 NPO after MN NO Solid Food.  Clear liquids from MN until--- 0430 Medications to take morning of surgery ----- NONE Diabetic medication ----- n/a Patient Special Instructions ----- n/a Pre-Op special Istructions ----- sent inbox message in epic to dr stechschulte for orders Patient verbalized understanding of instructions that were given at this phone interview. Patient denies shortness of breath, chest pain, fever, cough at this phone interview.

## 2020-03-12 NOTE — Anesthesia Preprocedure Evaluation (Addendum)
Anesthesia Evaluation  Patient identified by MRN, date of birth, ID band Patient awake    Reviewed: Allergy & Precautions, NPO status , Patient's Chart, lab work & pertinent test results  History of Anesthesia Complications Negative for: history of anesthetic complications  Airway Mallampati: II  TM Distance: >3 FB Neck ROM: Full    Dental no notable dental hx.    Pulmonary former smoker,    Pulmonary exam normal        Cardiovascular negative cardio ROS Normal cardiovascular exam     Neuro/Psych negative neurological ROS  negative psych ROS   GI/Hepatic negative GI ROS, Hx of liver lesions (abscesses?) with portal vein thrombosis in 2016, esophageal varices, portal hypertensive gastropathy, GI bleeds in 2016 and 2020   Endo/Other  negative endocrine ROS  Renal/GU negative Renal ROS  negative genitourinary   Musculoskeletal negative musculoskeletal ROS (+)   Abdominal   Peds  Hematology  (+) anemia , Hgb 9.8, plts 105k   Anesthesia Other Findings Day of surgery medications reviewed with patient.  Reproductive/Obstetrics negative OB ROS                           Anesthesia Physical Anesthesia Plan  ASA: III  Anesthesia Plan: General   Post-op Pain Management:    Induction: Intravenous  PONV Risk Score and Plan: 3 and Treatment may vary due to age or medical condition, Ondansetron, Dexamethasone and Midazolam  Airway Management Planned: Oral ETT  Additional Equipment: None  Intra-op Plan:   Post-operative Plan: Extubation in OR  Informed Consent: I have reviewed the patients History and Physical, chart, labs and discussed the procedure including the risks, benefits and alternatives for the proposed anesthesia with the patient or authorized representative who has indicated his/her understanding and acceptance.     Dental advisory given  Plan Discussed with:  CRNA  Anesthesia Plan Comments:       Anesthesia Quick Evaluation

## 2020-03-13 ENCOUNTER — Encounter (HOSPITAL_BASED_OUTPATIENT_CLINIC_OR_DEPARTMENT_OTHER): Payer: Self-pay | Admitting: Surgery

## 2020-03-13 ENCOUNTER — Ambulatory Visit (HOSPITAL_BASED_OUTPATIENT_CLINIC_OR_DEPARTMENT_OTHER): Payer: Medicare HMO | Admitting: Anesthesiology

## 2020-03-13 ENCOUNTER — Encounter (HOSPITAL_BASED_OUTPATIENT_CLINIC_OR_DEPARTMENT_OTHER)
Admission: RE | Disposition: A | Discharge status: Home / Self Care | Payer: Self-pay | Source: Home / Self Care | Attending: Surgery

## 2020-03-13 ENCOUNTER — Ambulatory Visit (HOSPITAL_BASED_OUTPATIENT_CLINIC_OR_DEPARTMENT_OTHER)
Admission: RE | Admit: 2020-03-13 | Discharge: 2020-03-13 | Disposition: A | Discharge status: Home / Self Care | Payer: Medicare HMO | Attending: Surgery | Admitting: Surgery

## 2020-03-13 DIAGNOSIS — E43 Unspecified severe protein-calorie malnutrition: Secondary | ICD-10-CM | POA: Diagnosis not present

## 2020-03-13 DIAGNOSIS — K4090 Unilateral inguinal hernia, without obstruction or gangrene, not specified as recurrent: Secondary | ICD-10-CM | POA: Diagnosis not present

## 2020-03-13 DIAGNOSIS — E785 Hyperlipidemia, unspecified: Secondary | ICD-10-CM | POA: Diagnosis not present

## 2020-03-13 DIAGNOSIS — Z86718 Personal history of other venous thrombosis and embolism: Secondary | ICD-10-CM | POA: Insufficient documentation

## 2020-03-13 DIAGNOSIS — K5730 Diverticulosis of large intestine without perforation or abscess without bleeding: Secondary | ICD-10-CM | POA: Diagnosis not present

## 2020-03-13 DIAGNOSIS — Z79899 Other long term (current) drug therapy: Secondary | ICD-10-CM | POA: Insufficient documentation

## 2020-03-13 DIAGNOSIS — Z87891 Personal history of nicotine dependence: Secondary | ICD-10-CM | POA: Insufficient documentation

## 2020-03-13 HISTORY — PX: INGUINAL HERNIA REPAIR: SHX194

## 2020-03-13 HISTORY — DX: Diverticulosis of large intestine without perforation or abscess without bleeding: K57.30

## 2020-03-13 HISTORY — DX: Personal history of other diseases of the digestive system: Z87.19

## 2020-03-13 HISTORY — DX: Elevated blood-pressure reading, without diagnosis of hypertension: R03.0

## 2020-03-13 HISTORY — DX: Unilateral inguinal hernia, without obstruction or gangrene, not specified as recurrent: K40.90

## 2020-03-13 HISTORY — DX: Personal history of diseases of the blood and blood-forming organs and certain disorders involving the immune mechanism: Z86.2

## 2020-03-13 HISTORY — DX: Presence of spectacles and contact lenses: Z97.3

## 2020-03-13 LAB — CBC
HCT: 32.4 % — ABNORMAL LOW (ref 39.0–52.0)
Hemoglobin: 9.8 g/dL — ABNORMAL LOW (ref 13.0–17.0)
MCH: 25.7 pg — ABNORMAL LOW (ref 26.0–34.0)
MCHC: 30.2 g/dL (ref 30.0–36.0)
MCV: 85 fL (ref 80.0–100.0)
Platelets: 105 10*3/uL — ABNORMAL LOW (ref 150–400)
RBC: 3.81 MIL/uL — ABNORMAL LOW (ref 4.22–5.81)
RDW: 17.2 % — ABNORMAL HIGH (ref 11.5–15.5)
WBC: 4.8 10*3/uL (ref 4.0–10.5)
nRBC: 0 % (ref 0.0–0.2)

## 2020-03-13 IMAGING — US US ABDOMINAL AORTA SCREENING AAA
1 series · 14 of 19 positions shown · non-contrast
Comparison: CT 10/30/2014.

CLINICAL DATA: History of smoking.

EXAM:
ULTRASOUND OF ABDOMINAL AORTA
TECHNIQUE: Ultrasound examination of the abdominal aorta was performed to
evaluate for abdominal aortic aneurysm.

[Series 1: us abdominal aorta screening aaa · 0.31mm/px · 14 of 19 slices shown]
[im 1/19]
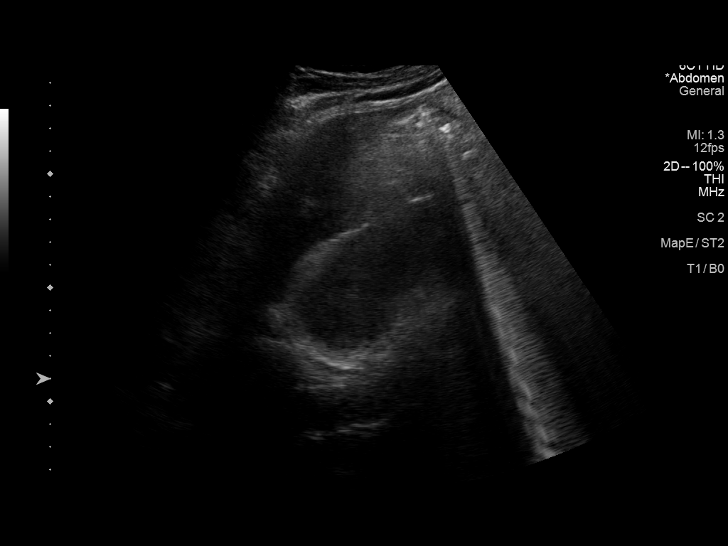
[im 3/19]
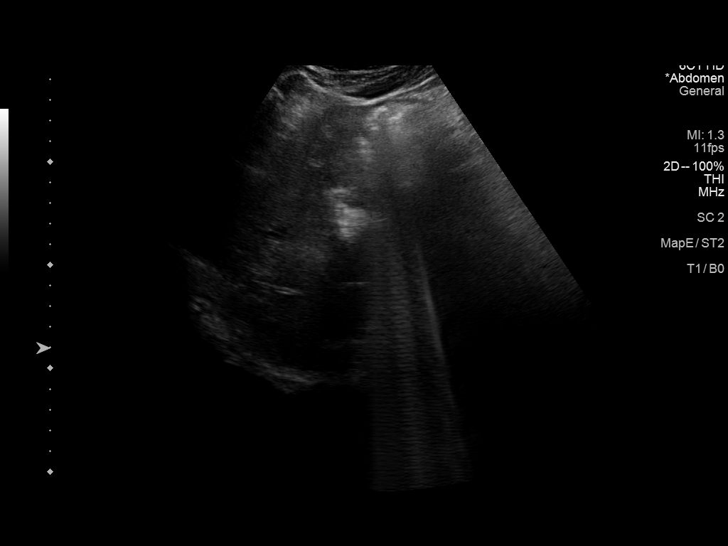
[im 4/19]
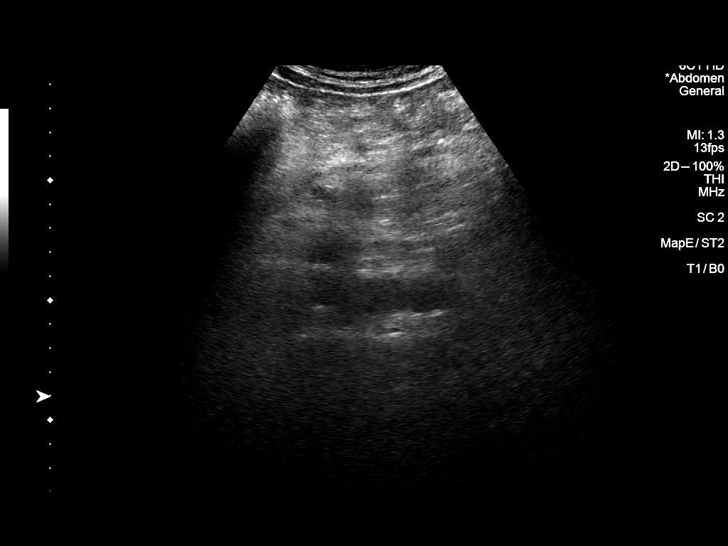
[im 5/19]
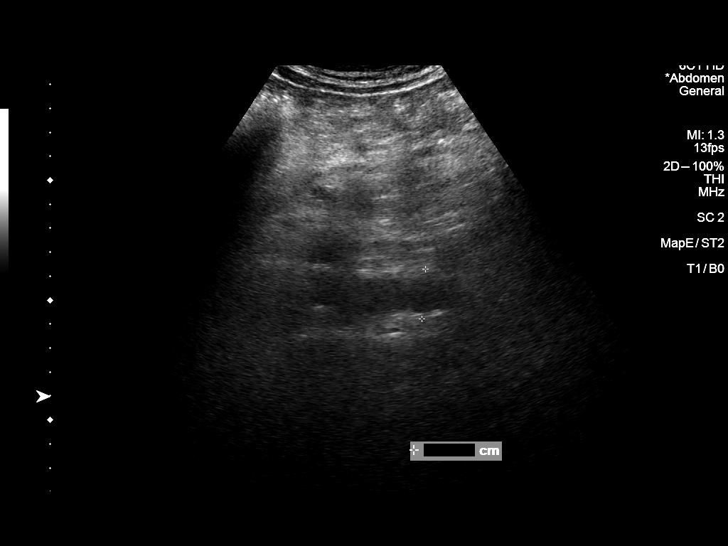
[im 7/19]
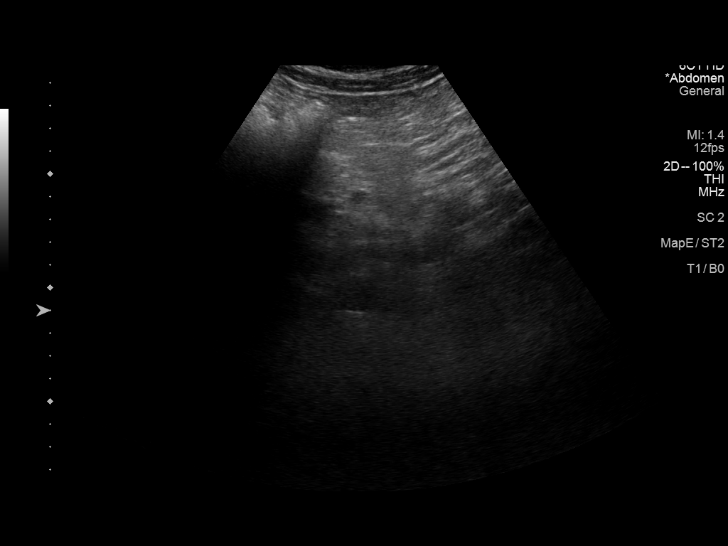
[im 8/19]
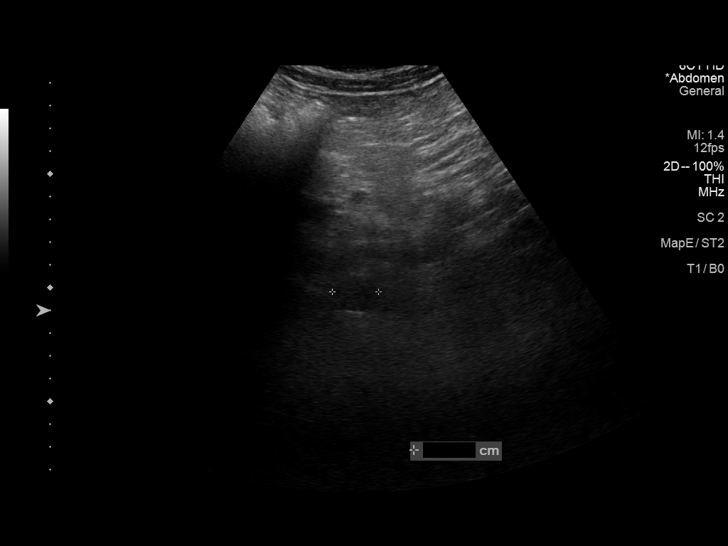
[im 9/19]
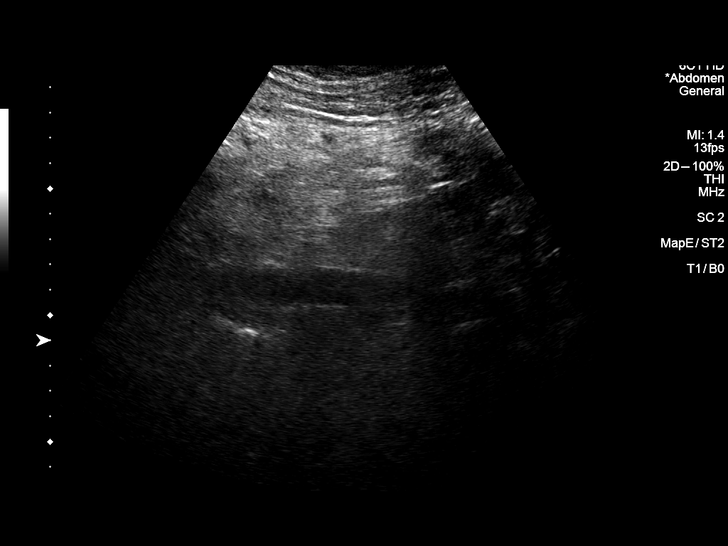
[im 11/19]
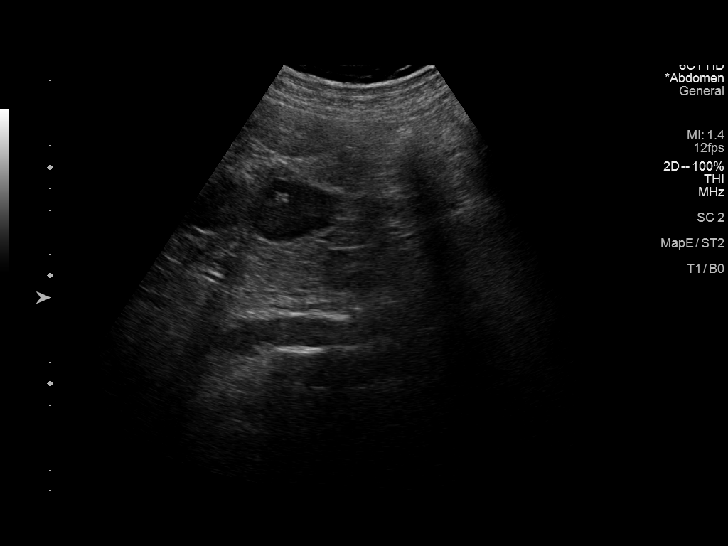
[im 12/19]
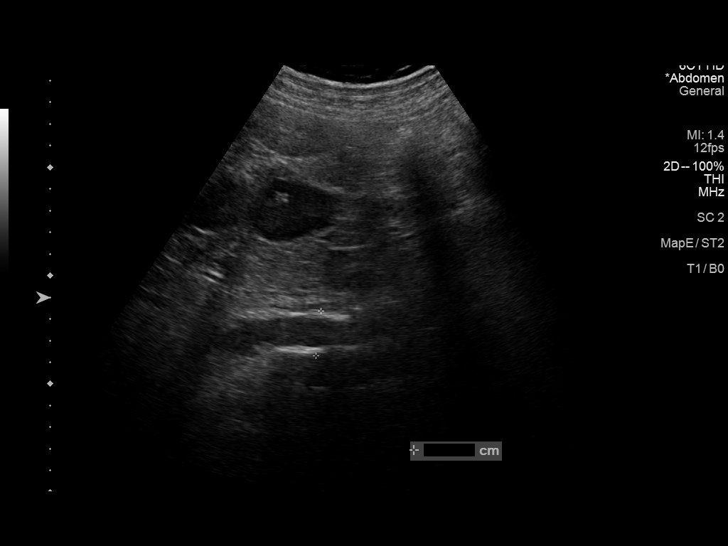
[im 13/19]
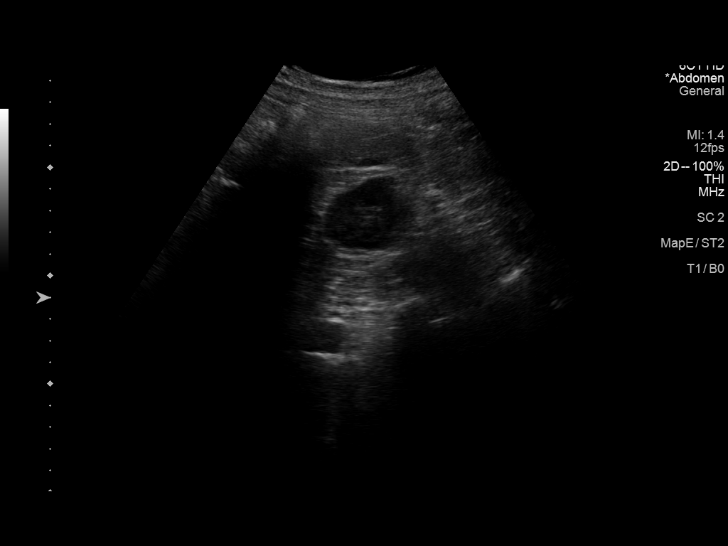
[im 15/19]
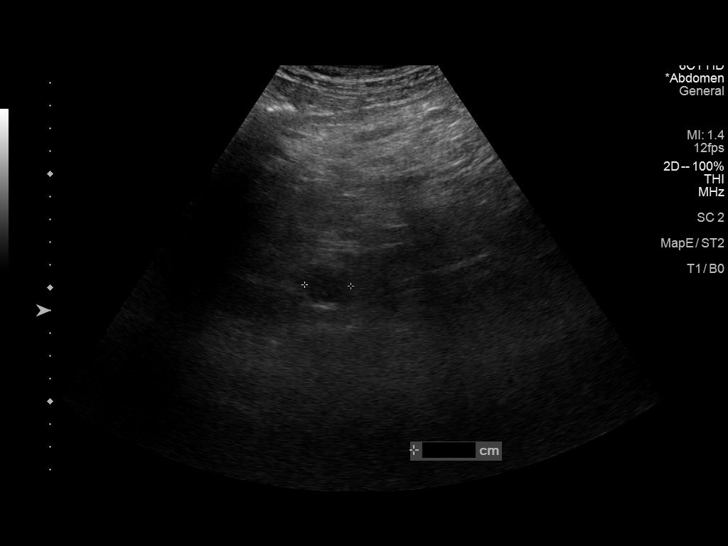
[im 16/19]
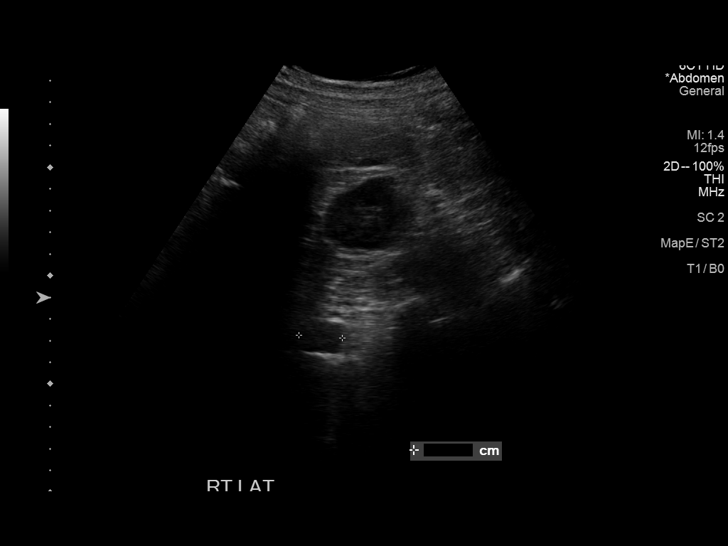
[im 17/19]
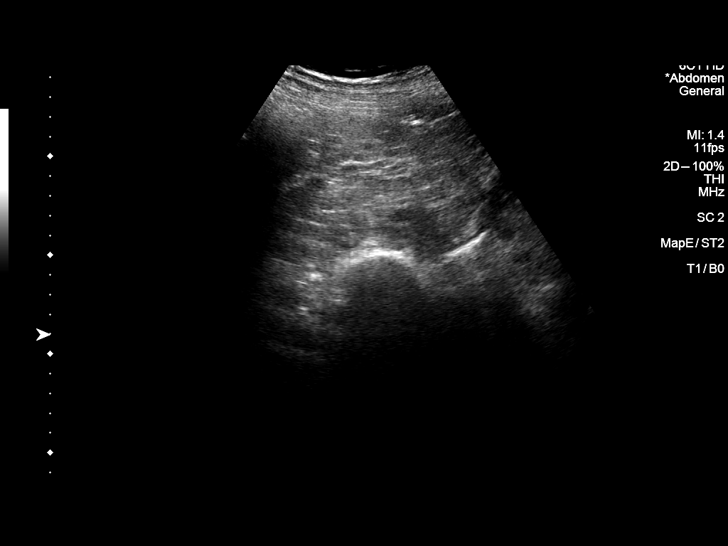
[im 19/19]
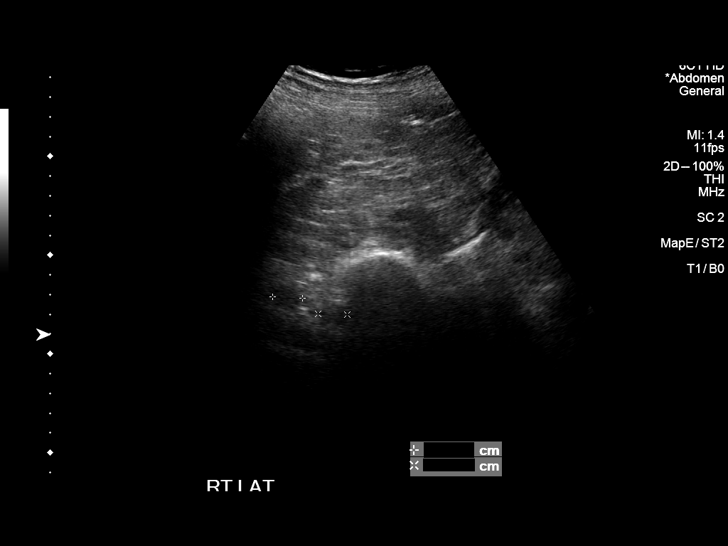

[14 of 19 positions shown; findings below may reference images not displayed]

FINDINGS: Limited exam due to overlying bowel gas.

Abdominal aortic measurements as follows:

Proximal:  2.3 cm cm

Mid:  2.1 cm cm

Distal:  2.1 cm cm
IMPRESSION: Limited exam due to overlying bowel gas. No evidence of aortic
aneurysm.

## 2020-03-13 SURGERY — REPAIR, HERNIA, INGUINAL, LAPAROSCOPIC
Anesthesia: General | Site: Abdomen | Laterality: Right

## 2020-03-13 MED ORDER — ENOXAPARIN SODIUM 40 MG/0.4ML ~~LOC~~ SOLN
SUBCUTANEOUS | Status: AC
  Filled 2020-03-13: qty 0.4

## 2020-03-13 MED ORDER — ONDANSETRON HCL 4 MG/2ML IJ SOLN
INTRAMUSCULAR | Status: DC | PRN
  Administered 2020-03-13: 4 mg via INTRAVENOUS

## 2020-03-13 MED ORDER — FENTANYL CITRATE (PF) 100 MCG/2ML IJ SOLN
INTRAMUSCULAR | Status: DC | PRN
  Administered 2020-03-13: 50 ug via INTRAVENOUS
  Administered 2020-03-13: 25 ug via INTRAVENOUS
  Administered 2020-03-13: 100 ug via INTRAVENOUS
  Administered 2020-03-13: 25 ug via INTRAVENOUS

## 2020-03-13 MED ORDER — FENTANYL CITRATE (PF) 100 MCG/2ML IJ SOLN
INTRAMUSCULAR | Status: AC
  Filled 2020-03-13: qty 2

## 2020-03-13 MED ORDER — LACTATED RINGERS IV SOLN: INTRAVENOUS | Status: DC

## 2020-03-13 MED ORDER — LIDOCAINE HCL (PF) 2 % IJ SOLN
INTRAMUSCULAR | Status: AC
  Filled 2020-03-13: qty 5

## 2020-03-13 MED ORDER — DEXAMETHASONE SODIUM PHOSPHATE 10 MG/ML IJ SOLN
INTRAMUSCULAR | Status: AC
  Filled 2020-03-13: qty 1

## 2020-03-13 MED ORDER — CEFAZOLIN SODIUM-DEXTROSE 2-4 GM/100ML-% IV SOLN
2.0000 g | INTRAVENOUS | Status: AC
  Administered 2020-03-13: 2 g via INTRAVENOUS

## 2020-03-13 MED ORDER — PHENYLEPHRINE 40 MCG/ML (10ML) SYRINGE FOR IV PUSH (FOR BLOOD PRESSURE SUPPORT)
PREFILLED_SYRINGE | INTRAVENOUS | Status: DC | PRN
  Administered 2020-03-13: 40 ug via INTRAVENOUS

## 2020-03-13 MED ORDER — ROCURONIUM BROMIDE 100 MG/10ML IV SOLN
INTRAVENOUS | Status: DC | PRN
  Administered 2020-03-13: 50 mg via INTRAVENOUS
  Administered 2020-03-13: 10 mg via INTRAVENOUS

## 2020-03-13 MED ORDER — BUPIVACAINE HCL 0.5 % IJ SOLN
INTRAMUSCULAR | Status: DC | PRN
  Administered 2020-03-13: 30 mL

## 2020-03-13 MED ORDER — GLYCOPYRROLATE 0.2 MG/ML IJ SOLN
INTRAMUSCULAR | Status: DC | PRN
  Administered 2020-03-13 (×2): .1 mg via INTRAVENOUS

## 2020-03-13 MED ORDER — ACETAMINOPHEN 500 MG PO TABS
ORAL_TABLET | ORAL | Status: AC
  Filled 2020-03-13: qty 2

## 2020-03-13 MED ORDER — SUGAMMADEX SODIUM 200 MG/2ML IV SOLN
INTRAVENOUS | Status: DC | PRN
  Administered 2020-03-13: 200 mg via INTRAVENOUS

## 2020-03-13 MED ORDER — PROPOFOL 10 MG/ML IV BOLUS
INTRAVENOUS | Status: DC | PRN
  Administered 2020-03-13: 160 mg via INTRAVENOUS
  Administered 2020-03-13: 40 mg via INTRAVENOUS

## 2020-03-13 MED ORDER — EPHEDRINE 5 MG/ML INJ
INTRAVENOUS | Status: AC
  Filled 2020-03-13: qty 10

## 2020-03-13 MED ORDER — PROPOFOL 10 MG/ML IV BOLUS
INTRAVENOUS | Status: AC
  Filled 2020-03-13: qty 40

## 2020-03-13 MED ORDER — EPHEDRINE SULFATE-NACL 50-0.9 MG/10ML-% IV SOSY
PREFILLED_SYRINGE | INTRAVENOUS | Status: DC | PRN
  Administered 2020-03-13: 10 mg via INTRAVENOUS
  Administered 2020-03-13: 5 mg via INTRAVENOUS

## 2020-03-13 MED ORDER — BUPIVACAINE LIPOSOME 1.3 % IJ SUSP
INTRAMUSCULAR | Status: DC | PRN
  Administered 2020-03-13: 20 mL

## 2020-03-13 MED ORDER — ACETAMINOPHEN 500 MG PO TABS
1000.0000 mg | ORAL_TABLET | ORAL | Status: AC
  Administered 2020-03-13: 1000 mg via ORAL

## 2020-03-13 MED ORDER — GABAPENTIN 300 MG PO CAPS
ORAL_CAPSULE | ORAL | Status: AC
  Filled 2020-03-13: qty 1

## 2020-03-13 MED ORDER — PHENYLEPHRINE 40 MCG/ML (10ML) SYRINGE FOR IV PUSH (FOR BLOOD PRESSURE SUPPORT)
PREFILLED_SYRINGE | INTRAVENOUS | Status: AC
  Filled 2020-03-13: qty 10

## 2020-03-13 MED ORDER — DEXAMETHASONE SODIUM PHOSPHATE 10 MG/ML IJ SOLN
INTRAMUSCULAR | Status: DC | PRN
  Administered 2020-03-13: 5 mg via INTRAVENOUS

## 2020-03-13 MED ORDER — ENOXAPARIN SODIUM 40 MG/0.4ML ~~LOC~~ SOLN
40.0000 mg | Freq: Once | SUBCUTANEOUS | Status: AC
  Administered 2020-03-13: 40 mg via SUBCUTANEOUS

## 2020-03-13 MED ORDER — MIDAZOLAM HCL 2 MG/2ML IJ SOLN
INTRAMUSCULAR | Status: AC
  Filled 2020-03-13: qty 2

## 2020-03-13 MED ORDER — CEFAZOLIN SODIUM-DEXTROSE 2-4 GM/100ML-% IV SOLN
INTRAVENOUS | Status: AC
  Filled 2020-03-13: qty 100

## 2020-03-13 MED ORDER — ONDANSETRON HCL 4 MG/2ML IJ SOLN
INTRAMUSCULAR | Status: AC
  Filled 2020-03-13: qty 2

## 2020-03-13 MED ORDER — OXYCODONE-ACETAMINOPHEN 5-325 MG PO TABS: 1.0000 | ORAL_TABLET | ORAL | 0 refills | Status: AC | PRN

## 2020-03-13 MED ORDER — ROCURONIUM BROMIDE 10 MG/ML (PF) SYRINGE
PREFILLED_SYRINGE | INTRAVENOUS | Status: AC
  Filled 2020-03-13: qty 10

## 2020-03-13 MED ORDER — GABAPENTIN 300 MG PO CAPS
300.0000 mg | ORAL_CAPSULE | ORAL | Status: AC
  Administered 2020-03-13: 300 mg via ORAL

## 2020-03-13 MED ORDER — GLYCOPYRROLATE PF 0.2 MG/ML IJ SOSY
PREFILLED_SYRINGE | INTRAMUSCULAR | Status: AC
  Filled 2020-03-13: qty 1

## 2020-03-13 MED ORDER — CHLORHEXIDINE GLUCONATE CLOTH 2 % EX PADS: 6.0000 | MEDICATED_PAD | Freq: Once | CUTANEOUS | Status: DC

## 2020-03-13 MED ORDER — LIDOCAINE 2% (20 MG/ML) 5 ML SYRINGE
INTRAMUSCULAR | Status: DC | PRN
  Administered 2020-03-13: 100 mg via INTRAVENOUS

## 2020-03-13 SURGICAL SUPPLY — 34 items
ADH SKN CLS APL DERMABOND .7 (GAUZE/BANDAGES/DRESSINGS) ×1
APL PRP STRL LF DISP 70% ISPRP (MISCELLANEOUS) ×1
BLADE CLIPPER SENSICLIP SURGIC (BLADE) IMPLANT
CABLE HIGH FREQUENCY MONO STRZ (ELECTRODE) ×2 IMPLANT
CHLORAPREP W/TINT 26 (MISCELLANEOUS) ×2 IMPLANT
COVER WAND RF STERILE (DRAPES) ×2 IMPLANT
DECANTER SPIKE VIAL GLASS SM (MISCELLANEOUS) IMPLANT
DERMABOND ADVANCED (GAUZE/BANDAGES/DRESSINGS) ×1
DERMABOND ADVANCED .7 DNX12 (GAUZE/BANDAGES/DRESSINGS) ×1 IMPLANT
ELECT REM PT RETURN 9FT ADLT (ELECTROSURGICAL) ×2
ELECTRODE REM PT RTRN 9FT ADLT (ELECTROSURGICAL) ×1 IMPLANT
GLOVE SURG ENC MOIS LTX SZ7 (GLOVE) ×2 IMPLANT
GLOVE SURG UNDER POLY LF SZ7.5 (GLOVE) ×2 IMPLANT
GOWN STRL REUS W/ TWL XL LVL3 (GOWN DISPOSABLE) ×1 IMPLANT
GOWN STRL REUS W/TWL LRG LVL3 (GOWN DISPOSABLE) ×4 IMPLANT
GOWN STRL REUS W/TWL XL LVL3 (GOWN DISPOSABLE) ×2
GRASPER SUT TROCAR 14GX15 (MISCELLANEOUS) ×2 IMPLANT
KIT TURNOVER CYSTO (KITS) ×2 IMPLANT
MESH 3DMAX 5X7 RT XLRG (Mesh General) ×2 IMPLANT
NEEDLE INSUFFLATION 120MM (ENDOMECHANICALS) ×2 IMPLANT
PACK BASIN DAY SURGERY FS (CUSTOM PROCEDURE TRAY) ×2 IMPLANT
RELOAD STAPLE HERNIA 4.0 BLUE (INSTRUMENTS) ×2 IMPLANT
RELOAD STAPLE HERNIA 4.8 BLK (STAPLE) ×2 IMPLANT
SCISSORS LAP 5X35 DISP (ENDOMECHANICALS) ×2 IMPLANT
SET TUBE SMOKE EVAC HIGH FLOW (TUBING) ×2 IMPLANT
STAPLER HERNIA 12 8.5 360D (INSTRUMENTS) ×2 IMPLANT
SUT MNCRL AB 4-0 PS2 18 (SUTURE) ×2 IMPLANT
SUT VICRYL 0 UR6 27IN ABS (SUTURE) IMPLANT
TOWEL OR 17X26 10 PK STRL BLUE (TOWEL DISPOSABLE) ×2 IMPLANT
TRAY FOL W/BAG SLVR 16FR STRL (SET/KITS/TRAYS/PACK) ×1 IMPLANT
TRAY FOLEY W/BAG SLVR 16FR LF (SET/KITS/TRAYS/PACK) ×2
TRAY LAPAROSCOPIC (CUSTOM PROCEDURE TRAY) ×2 IMPLANT
TROCAR BLADELESS OPT 12M 100M (ENDOMECHANICALS) ×2 IMPLANT
TROCAR BLADELESS OPT 5 100 (ENDOMECHANICALS) ×4 IMPLANT

## 2020-03-13 NOTE — Anesthesia Procedure Notes (Signed)
Procedure Name: Intubation Date/Time: 03/13/2020 7:44 AM Performed by: Gwyndolyn Saxon, CRNA Pre-anesthesia Checklist: Patient identified, Emergency Drugs available, Suction available and Patient being monitored Patient Re-evaluated:Patient Re-evaluated prior to induction Oxygen Delivery Method: Circle system utilized Preoxygenation: Pre-oxygenation with 100% oxygen Induction Type: IV induction Ventilation: Mask ventilation without difficulty Laryngoscope Size: Miller and 2 Grade View: Grade III Tube type: Oral Tube size: 7.5 mm Number of attempts: 1 Airway Equipment and Method: Patient positioned with wedge pillow and Stylet Placement Confirmation: positive ETCO2 and breath sounds checked- equal and bilateral Secured at: 24 cm Tube secured with: Tape Dental Injury: Teeth and Oropharynx as per pre-operative assessment

## 2020-03-13 NOTE — Anesthesia Procedure Notes (Addendum)
Procedure Name: LMA Insertion Date/Time: 03/13/2020 7:44 AM Performed by: Georgeanne Nim, CRNA Pre-anesthesia Checklist: Patient identified, Emergency Drugs available, Suction available, Patient being monitored and Timeout performed Patient Re-evaluated:Patient Re-evaluated prior to induction Oxygen Delivery Method: Circle system utilized Preoxygenation: Pre-oxygenation with 100% oxygen Induction Type: IV induction LMA Size: 5.0 Number of attempts: 1 Placement Confirmation: positive ETCO2,  CO2 detector and breath sounds checked- equal and bilateral Tube secured with: Tape Dental Injury: Teeth and Oropharynx as per pre-operative assessment  Comments: charted in error by Danella Deis, CRNA. Attempted to remove this airway note from chart but unable to do so.

## 2020-03-13 NOTE — Discharge Instructions (Signed)
GROIN HERNIA REPAIR POST OPERATIVE INSTRUCTIONS  Thinking Clearly  . The anesthesia may cause you to feel different for 1 or 2 days. Do not drive, drink alcohol, or make any big decisions for at least 2 days.  Nutrition . When you wake up, you will be able to drink small amounts of liquid. If you do not feel sick, you can slowly advance your diet to regular foods. . Continue to drink lots of fluids, usually about 8 to 10 glasses per day. . Eat a high-fiber diet so you don't strain during bowel movements. . High-Fiber Foods o Foods high in fiber include beans, bran cereals and whole-grain breads, peas, dried fruit (figs, apricots, and dates), raspberries, blackberries, strawberries, sweet corn, broccoli, baked potatoes with skin, plums, pears, apples, greens, and nuts. Activity . Slowly increase your activity. Be sure to get up and walk every hour or so to prevent blood clots. . No heavy lifting or strenuous activity for 4 weeks following surgery to prevent hernias at your incision sites or recurrence of your hernia. . It is normal to feel tired. You may need more sleep than usual.  Get your rest but make sure to get up and move around frequently to prevent blood clots and pneumonia.  Work and Return to [employer] . You can go back to work when you feel well enough. Discuss the timing with your surgeon. . You can usually go back to school or work 1 week or less after an laparoscopic or an open repair. . If your work requires heavy lifting or strenuous activity you need to be placed on light duty for 4 weeks following surgery. . You can return to gym class, sports or other physical activities 4 weeks after surgery.  Wound Care . You may experience significant bruising in the groin including into the scrotum in males.  Rest, elevating the groin and scrotum above the level of the heart, ice and compression with tight fitting underwear can help.  . Always wash your hands before and after touching  near your incision site. . Do not soak in a bathtub until cleared at your follow up appointment. You may take a shower 24 hours after surgery. . A small amount of drainage from the incision is normal. If the drainage is thick and yellow or the site is red, you may have an infection, so call your surgeon. . If you have a drain in one of your incisions, it will be taken out in office when the drainage stops. . Steri-Strips will fall off in 7 to 10 days or they will be removed during your first office visit. . If you have dermabond glue covering over the incision, allow the glue to flake off on its own. . Protect the new skin, especially from the sun. The sun can burn and cause darker scarring. . Your scar will heal in about 4 to 6 weeks and will become softer and continue to fade over the next year.  The cosmetic appearance of the incisions will improve over the course of the first year after surgery. . Sensation around your incision will return in a few weeks or months.  Bowel Movements . After intestinal surgery, you may have loose watery stools for several days. If watery diarrhea lasts longer than 3 days, contact your surgeon. . Pain medication (narcotics) can cause constipation. Increase the fiber in your diet with high-fiber foods if you are constipated. You can take an over the counter stool softener like Colace  to avoid constipation.  Additional over the counter medications can also be used if Colace isn't sufficient (for example, Milk of Magnesia or Miralax).  Pain . The amount of pain is different for each person. Some people need only 1 to 3 doses of pain control medication, while others need more. . Take alternating doses of tylenol and ibuprofen around the clock for the first five days following surgery.  This will provide a baseline of pain control and help with inflammation.  Take the narcotic pain medication in addition if needed for severe pain.  Contact Your Surgeon at [phone],  if you have: . Pain that will not go away . Pain that gets worse . A fever of more than 101F (38.3C) . Repeated vomiting . Swelling, redness, bleeding, or bad-smelling drainage from your wound site . Strong abdominal pain . No bowel movement or unable to pass gas for 3 days . Watery diarrhea lasting longer than 3 days  Pain Control . The goal of pain control is to minimize pain, keep you moving and help you heal. Your surgical team will work with you on your pain plan. Most often a combination of therapies and medications are used to control your pain. You may also be given medication (local anesthetic) at the surgical site. This may help control your pain for several days. . Extreme pain puts extra stress on your body at a time when your body needs to focus on healing. Do not wait until your pain has reached a level "10" or is unbearable before telling your doctor or nurse. It is much easier to control pain before it becomes severe. . Following a laparoscopic procedure, pain is sometimes felt in the shoulder. This is due to the gas inserted into your abdomen during the procedure. Moving and walking helps to decrease the gas and the right shoulder pain.  . Use the guide below for ways to manage your post-operative pain. Learn more by going to facs.org/safepaincontrol.  How Intense Is My Pain Common Therapies to Feel Better       I hardly notice my pain, and it does not interfere with my activities.  I notice my pain and it distracts me, but I can still do activities (sitting up, walking, standing).  Non-Medication Therapies  Ice (in a bag, applied over clothing at the surgical site), elevation, rest, meditation, massage, distraction (music, TV, play) walking and mild exercise Splinting the abdomen with pillows +  Non-Opioid Medications Acetaminophen (Tylenol) Non-steroidal anti-inflammatory drugs (NSAIDS) Aspirin, Ibuprofen (Motrin, Advil) Naproxen (Aleve) Take these as  needed, when you feel pain. Both acetaminophen and NSAIDs help to decrease pain and swelling (inflammation).      My pain is hard to ignore and is more noticeable even when I rest.  My pain interferes with my usual activities.  Non-Medication Therapies  +  Non-Opioid medications  Take on a regular schedule (around-the-clock) instead of as needed. (For example, Tylenol every 6 hours at 9:00 am, 3:00 pm, 9:00 pm, 3:00 am and Motrin every 6 hours at 12:00 am, 6:00 am, 12:00 pm, 6:00 pm)         I am focused on my pain, and I am not doing my daily activities.  I am groaning in pain, and I cannot sleep. I am unable to do anything.  My pain is as bad as it could be, and nothing else matters.  Non-Medication Therapies  +  Around-the-Clock Non-Opioid Medications  +  Short-acting opioids  Opioids should be  used with other medications to manage severe pain. Opioids block pain and give a feeling of euphoria (feel high). Addiction, a serious side effect of opioids, is rare with short-term (a few days) use.  Examples of short-acting opioids include: Tramadol (Ultram), Hydrocodone (Norco, Vicodin), Hydromorphone (Dilaudid), Oxycodone (Oxycontin)     The above directions have been adapted from the [employer] Surgical Patient Education Program.  Please refer to the ACS website if needed: PreferredVet.ca.ashx   Louanna Raw, MD [facility], [address], [place], [place], [place]  [address] ?  [address] [place], [place]   [address] [phone] ? [phone] ? FAX [phone] Web site: [email or web address]      Post Anesthesia Home Care Instructions  Activity: Get plenty of rest for the remainder of the day. A responsible individual must stay with you for 24 hours following the procedure.  For the next 24 hours, DO NOT: -Drive a car -Conservation officer, nature -Drink alcoholic beverages -Take any medication unless instructed by your physician -Make any legal decisions or sign important papers.  Meals: Start with liquid foods such as gelatin or soup. Progress to regular foods as tolerated. Avoid greasy, spicy, heavy foods. If nausea and/or vomiting occur, drink only clear liquids until the nausea and/or vomiting subsides. Call your physician if vomiting continues.  Special Instructions/Symptoms: Your throat may feel dry or sore from the anesthesia or the breathing tube placed in your throat during surgery. If this causes discomfort, gargle with warm salt water. The discomfort should disappear within 24 hours.   Marland Kitchen

## 2020-03-13 NOTE — Anesthesia Postprocedure Evaluation (Signed)
Anesthesia Post Note  Patient: Marc Tyler  Procedure(s) Performed: LAPAROSCOPIC RIGHT INGUINAL HERNIA REPAIR WITH MESH (Right Abdomen)     Patient location during evaluation: PACU Anesthesia Type: General Level of consciousness: awake and alert and oriented Pain management: pain level controlled Vital Signs Assessment: post-procedure vital signs reviewed and stable Respiratory status: spontaneous breathing, nonlabored ventilation and respiratory function stable Cardiovascular status: blood pressure returned to baseline Postop Assessment: no apparent nausea or vomiting Anesthetic complications: no   No complications documented.  Last Vitals:  Vitals:   03/13/20 0930 03/13/20 0945  BP: (!) 169/85 (!) 184/85  Pulse: 82 81  Resp: 11 14  Temp:    SpO2: 98% 99%    Last Pain:  Vitals:   03/13/20 0945  TempSrc:   PainSc: 0-No pain                 Brennan Bailey

## 2020-03-13 NOTE — Transfer of Care (Signed)
Immediate Anesthesia Transfer of Care Note  Patient: Marc Tyler  Procedure(s) Performed: LAPAROSCOPIC RIGHT INGUINAL HERNIA REPAIR WITH MESH (Right Abdomen)  Patient Location: PACU  Anesthesia Type:General  Level of Consciousness: awake, alert  and patient cooperative  Airway & Oxygen Therapy: Patient Spontanous Breathing and Patient connected to nasal cannula oxygen  Post-op Assessment: Report given to RN and Post -op Vital signs reviewed and stable  Post vital signs: Reviewed and stable  Last Vitals:  Vitals Value Taken Time  BP 190/96 03/13/20 0901  Temp    Pulse 98 03/13/20 0903  Resp 17 03/13/20 0903  SpO2 100 % 03/13/20 0903  Vitals shown include unvalidated device data.  Last Pain:  Vitals:   03/13/20 0558  TempSrc: Oral  PainSc: 0-No pain      Patients Stated Pain Goal: 5 (78/46/96 2952)  Complications: No complications documented.

## 2020-03-13 NOTE — Op Note (Signed)
   Patient: Marc Tyler (1951-01-24, [id number])  Date of Surgery: 03/13/2020   Preoperative Diagnosis: RIGHT INGUINAL HERNIA   Postoperative Diagnosis: RIGHT INGUINAL HERNIA   Surgical Procedure: LAPAROSCOPIC RIGHT INGUINAL HERNIA REPAIR WITH MESH:    Operative Team Members:  Surgeon(s) and Role:    * Jonanthony Nahar, Nickola Major, MD - Primary   Anesthesiologist: Brennan Bailey, MD CRNA: Gwyndolyn Saxon, CRNA   Anesthesia: General   Fluids:  Total I/O In: 100 [IV Piggyback:100] Out: 205 [ERDEY:814; GYJEH:6]  Complications: None  Drains:  None  Specimen: None  Disposition:  PACU - hemodynamically stable.  Plan of Care: Discharge to home after PACU  Indications for Procedure: Marc Tyler is a 70 y.o. male who presented with a right inguinal hernia  Findings:  Technique: Transabdominal preperitoneal (TAPP) Hernia Location: Right indirect inguinal hernia Mesh Size &Type: Bard 3 DMax extra-large right mesh Mesh Fixation: Endo-Universal hernia stapler   Description of Procedure:  The patient was positioned supine, padded and secured to the bed, with both arms tucked.  The abdomen was widely prepped and draped.  A time out procedure was performed.  A 1 cm infraumbilical incision was made.  The abdomen was entered utilizing a Veress needle at the umbilical stalk, using a kocher clamp to elevate the umbilical stalk.  The abdomen was insufflated to 15 mm of Hg.  A blunt 12 mm trocar was inserted using the optical techinque.  Additional 5 mm trocars were placed in the left and right abdomen.  There was an indirect hernia on the RIGHT.  Utilizing a transabdominal pre peritoneal technique (TAPP), a horizontal incision was made in the peritoneum, immediately below the umbilicus.  Dissection was carried out in the pre peritoneal space down to the level of the hernia sac which was reduced into the peritoneal cavity completely.  The cord contents were parietalized and preserved.  A  large pre peritoneal dissection was performed to uncover the direct, indirect, femoral and obturator spaces.  Cooper's ligament was uncovered medially and the psoas muscle uncovered laterally.  The Bard 3D max extra-large right-sided mesh, as documented above, was opened and advanced into the pre peritoneal position so that it more than adequately covered the indirect, direct, femoral and obturator spaces.  The mesh laid flat, with no inferior folds and covered the entire myopectineal orifice.  The mesh was fixated with the endo-universal hernia stapler to Cooper's ligament and the posterior aspect of the rectus muscle.  The peritoneal flap was closed with the same device.  There were no peritoneal defects or exposed mesh at the conclusion.  The umbilical trocar was removed and the fascial defect was closed with a simple 0 Vicryl suture, utilizing a suture passer.  The peritoneal cavity was completely desufflated, the trocars removed and the skin closed with 4-0 Monocryl subcuticular suture and skin glue.  All sponge and needle counts were correct at the end of the case.  Louanna Raw, MD General, Bariatric, & Minimally Invasive Surgery [facility], [place]

## 2020-03-13 NOTE — Addendum Note (Signed)
Addendum  created 03/13/20 1047 by Gwyndolyn Saxon, CRNA   Clinical Note Signed, Intraprocedure Blocks edited

## 2020-03-13 NOTE — H&P (Signed)
Admitting Physician: Nickola Major Marc Tyler  Service: General surgery  CC: Right inguinal hernia  Subjective   HPI: Marc Tyler is an 70 y.o. male who is here for elective laparoscopic right inguinal hernia repair with mesh.  Past Medical History:  Diagnosis Date  . Diverticulosis of colon   . Elevated blood pressure reading without diagnosis of hypertension    03-11-2020 per pt was told this by his pcp, never been on medication  . History of anemia   . History of GI bleed 2016;  2020    (per pt previously seen by dr Watt Climes)   07/ 2016 admitted @[facility]  for gi bleed w/ chronic blood loss, extensive portal vein thrombosis, liver lesions/ masses,  d/c'd  pvt tx with xarelto, liver bx's showed no malignancy inflammatory process;  05-06-2018 admit @[facility]  egd showed no active bleeding, esophageal varices very mild portal hypertesive gastropathy, duodenal erosion, thrombocytopenia  . History of thrombosis 08/2014   admitted @[facility]   extensive portal vein thrombosis,  completed xarelto,  stated no issue since and no other clots  . Hyperlipidemia   . Right inguinal hernia   . Wears glasses     Past Surgical History:  Procedure Laterality Date  . ANAL FISSURE REPAIR  x6  last one 2011  . COLONOSCOPY WITH PROPOFOL  last one , approx 2017  . ESOPHAGOGASTRODUODENOSCOPY (EGD) WITH PROPOFOL N/A 05/07/2018   Procedure: ESOPHAGOGASTRODUODENOSCOPY (EGD) WITH PROPOFOL;  Surgeon: Clarene Essex, MD;  Location: [facility];  Service: Endoscopy;  Laterality: N/A;  . INGUINAL HERNIA REPAIR Left age 70    History reviewed. No pertinent family history.  Social:  reports that he quit smoking about 12 years ago. His smoking use included cigarettes. He quit after 30.00 years of use. He has never used smokeless tobacco. He reports current alcohol use. He reports that he does not use drugs.  Allergies: No Known Allergies  Medications: Current Outpatient Medications  Medication Instructions  . Coenzyme Q10 (COQ10)  200 MG CAPS 1 capsule, Oral, Daily  . Melatonin 10 mg, Oral, At bedtime PRN  . Multiple Vitamins-Minerals (MULTIVITAMIN WITH MINERALS) tablet 1 tablet, Oral, Daily  . Multiple Vitamins-Minerals (PRESERVISION AREDS 2 PO) 1 capsule, Oral, Daily at bedtime, ACUVIT  . rosuvastatin (CRESTOR) 20 mg, Oral, Daily    ROS - all of the below systems have been reviewed with the patient and positives are indicated with bold text General: chills, fever or night sweats Eyes: blurry vision or double vision ENT: epistaxis or sore throat Allergy/Immunology: itchy/watery eyes or nasal congestion Hematologic/Lymphatic: bleeding problems, blood clots or swollen lymph nodes Endocrine: temperature intolerance or unexpected weight changes Breast: new or changing breast lumps or nipple discharge Resp: cough, shortness of breath, or wheezing CV: chest pain or dyspnea on exertion GI: as per HPI GU: dysuria, trouble voiding, or hematuria MSK: joint pain or joint stiffness Neuro: TIA or stroke symptoms Derm: pruritus and skin lesion changes Psych: anxiety and depression  Objective   PE Blood pressure (!) 181/86, pulse 66, temperature (!) 97.5 F (36.4 C), temperature source Oral, resp. rate 16, height 5\' 10"  (1.778 m), weight 84.8 kg, SpO2 100 %. Constitutional: NAD; conversant; no deformities Eyes: Moist conjunctiva; no lid lag; anicteric; PERRL Neck: Trachea midline; no thyromegaly Lungs: Normal respiratory effort; no tactile fremitus CV: RRR; no palpable thrills; no pitting edema GI: Abd Right inguinal hernia; no palpable hepatosplenomegaly MSK: Normal range of motion of extremities; no clubbing/cyanosis Psychiatric: Appropriate affect; alert and oriented x3 Lymphatic: No palpable  cervical or axillary lymphadenopathy  Results for orders placed or performed during the hospital encounter of 03/13/20 (from the past 24 hour(s))  CBC     Status: Abnormal (Preliminary result)   Collection Time: 03/13/20   6:22 AM  Result Value Ref Range   WBC 4.8 4.0 - 10.5 K/uL   RBC 3.81 (L) 4.22 - 5.81 MIL/uL   Hemoglobin 9.8 (L) 13.0 - 17.0 g/dL   HCT 32.4 (L) 39.0 - 52.0 %   MCV 85.0 80.0 - 100.0 fL   MCH 25.7 (L) 26.0 - 34.0 pg   MCHC 30.2 30.0 - 36.0 g/dL   RDW 17.2 (H) 11.5 - 15.5 %   Platelets PENDING 150 - 400 K/uL   nRBC 0.0 0.0 - 0.2 %    Imaging Orders  No imaging studies ordered today     Assessment and Plan   Marc Tyler is an 70 y.o. male with right inguinal hernia, here for elective laparoscopic repair.  The risks, benefits and alternatives were discussed and the patient granted consent to proceed.  We will proceed as scheduled.  Felicie Morn, MD  [facility] Use [email or web address] to contact on call provider

## 2020-03-16 ENCOUNTER — Encounter (HOSPITAL_BASED_OUTPATIENT_CLINIC_OR_DEPARTMENT_OTHER): Payer: Self-pay | Admitting: Surgery

## 2020-06-10 DIAGNOSIS — D3132 Benign neoplasm of left choroid: Secondary | ICD-10-CM | POA: Diagnosis not present

## 2020-11-10 DIAGNOSIS — R0981 Nasal congestion: Secondary | ICD-10-CM | POA: Diagnosis not present

## 2020-11-10 DIAGNOSIS — R519 Headache, unspecified: Secondary | ICD-10-CM | POA: Diagnosis not present

## 2020-11-10 DIAGNOSIS — U071 COVID-19: Secondary | ICD-10-CM | POA: Diagnosis not present

## 2021-02-16 DIAGNOSIS — Z125 Encounter for screening for malignant neoplasm of prostate: Secondary | ICD-10-CM | POA: Diagnosis not present

## 2021-02-16 DIAGNOSIS — E7800 Pure hypercholesterolemia, unspecified: Secondary | ICD-10-CM | POA: Diagnosis not present

## 2021-02-16 DIAGNOSIS — Z79899 Other long term (current) drug therapy: Secondary | ICD-10-CM | POA: Diagnosis not present

## 2021-02-22 DIAGNOSIS — R7301 Impaired fasting glucose: Secondary | ICD-10-CM | POA: Diagnosis not present

## 2021-02-22 DIAGNOSIS — E7800 Pure hypercholesterolemia, unspecified: Secondary | ICD-10-CM | POA: Diagnosis not present

## 2021-02-22 DIAGNOSIS — Z0000 Encounter for general adult medical examination without abnormal findings: Secondary | ICD-10-CM | POA: Diagnosis not present

## 2021-02-22 DIAGNOSIS — Z23 Encounter for immunization: Secondary | ICD-10-CM | POA: Diagnosis not present

## 2021-02-22 DIAGNOSIS — Z125 Encounter for screening for malignant neoplasm of prostate: Secondary | ICD-10-CM | POA: Diagnosis not present

## 2021-02-22 DIAGNOSIS — D696 Thrombocytopenia, unspecified: Secondary | ICD-10-CM | POA: Diagnosis not present

## 2021-02-22 DIAGNOSIS — I10 Essential (primary) hypertension: Secondary | ICD-10-CM | POA: Diagnosis not present

## 2021-03-17 DIAGNOSIS — I10 Essential (primary) hypertension: Secondary | ICD-10-CM | POA: Diagnosis not present

## 2021-05-20 DIAGNOSIS — I10 Essential (primary) hypertension: Secondary | ICD-10-CM | POA: Diagnosis not present

## 2021-06-18 DIAGNOSIS — H35363 Drusen (degenerative) of macula, bilateral: Secondary | ICD-10-CM | POA: Diagnosis not present

## 2021-08-11 DIAGNOSIS — R7309 Other abnormal glucose: Secondary | ICD-10-CM | POA: Diagnosis not present

## 2021-08-11 DIAGNOSIS — R252 Cramp and spasm: Secondary | ICD-10-CM | POA: Diagnosis not present

## 2022-01-17 DIAGNOSIS — Z20828 Contact with and (suspected) exposure to other viral communicable diseases: Secondary | ICD-10-CM | POA: Diagnosis not present

## 2022-02-25 DIAGNOSIS — E860 Dehydration: Secondary | ICD-10-CM | POA: Diagnosis not present

## 2022-03-03 DIAGNOSIS — Z125 Encounter for screening for malignant neoplasm of prostate: Secondary | ICD-10-CM | POA: Diagnosis not present

## 2022-03-03 DIAGNOSIS — I10 Essential (primary) hypertension: Secondary | ICD-10-CM | POA: Diagnosis not present

## 2022-03-03 DIAGNOSIS — R7301 Impaired fasting glucose: Secondary | ICD-10-CM | POA: Diagnosis not present

## 2022-03-03 DIAGNOSIS — E7800 Pure hypercholesterolemia, unspecified: Secondary | ICD-10-CM | POA: Diagnosis not present

## 2022-03-03 DIAGNOSIS — D696 Thrombocytopenia, unspecified: Secondary | ICD-10-CM | POA: Diagnosis not present

## 2022-03-09 DIAGNOSIS — R7303 Prediabetes: Secondary | ICD-10-CM | POA: Diagnosis not present

## 2022-03-09 DIAGNOSIS — E7800 Pure hypercholesterolemia, unspecified: Secondary | ICD-10-CM | POA: Diagnosis not present

## 2022-03-09 DIAGNOSIS — D696 Thrombocytopenia, unspecified: Secondary | ICD-10-CM | POA: Diagnosis not present

## 2022-03-09 DIAGNOSIS — Z0000 Encounter for general adult medical examination without abnormal findings: Secondary | ICD-10-CM | POA: Diagnosis not present

## 2022-03-09 DIAGNOSIS — R252 Cramp and spasm: Secondary | ICD-10-CM | POA: Diagnosis not present

## 2022-03-09 DIAGNOSIS — R7309 Other abnormal glucose: Secondary | ICD-10-CM | POA: Diagnosis not present

## 2022-03-09 DIAGNOSIS — I10 Essential (primary) hypertension: Secondary | ICD-10-CM | POA: Diagnosis not present

## 2022-04-21 DIAGNOSIS — Z6829 Body mass index (BMI) 29.0-29.9, adult: Secondary | ICD-10-CM | POA: Diagnosis not present

## 2022-04-21 DIAGNOSIS — R609 Edema, unspecified: Secondary | ICD-10-CM | POA: Diagnosis not present

## 2022-04-21 DIAGNOSIS — I10 Essential (primary) hypertension: Secondary | ICD-10-CM | POA: Diagnosis not present

## 2022-06-17 DIAGNOSIS — D3132 Benign neoplasm of left choroid: Secondary | ICD-10-CM | POA: Diagnosis not present

## 2022-06-17 DIAGNOSIS — H2513 Age-related nuclear cataract, bilateral: Secondary | ICD-10-CM | POA: Diagnosis not present

## 2022-08-17 DIAGNOSIS — H2513 Age-related nuclear cataract, bilateral: Secondary | ICD-10-CM | POA: Diagnosis not present

## 2022-08-17 DIAGNOSIS — H18413 Arcus senilis, bilateral: Secondary | ICD-10-CM | POA: Diagnosis not present

## 2022-08-17 DIAGNOSIS — H25043 Posterior subcapsular polar age-related cataract, bilateral: Secondary | ICD-10-CM | POA: Diagnosis not present

## 2022-08-17 DIAGNOSIS — H35363 Drusen (degenerative) of macula, bilateral: Secondary | ICD-10-CM | POA: Diagnosis not present

## 2022-08-17 DIAGNOSIS — H2511 Age-related nuclear cataract, right eye: Secondary | ICD-10-CM | POA: Diagnosis not present

## 2022-09-20 DIAGNOSIS — H2512 Age-related nuclear cataract, left eye: Secondary | ICD-10-CM | POA: Diagnosis not present

## 2022-09-20 DIAGNOSIS — H25042 Posterior subcapsular polar age-related cataract, left eye: Secondary | ICD-10-CM | POA: Diagnosis not present

## 2022-09-20 DIAGNOSIS — H2511 Age-related nuclear cataract, right eye: Secondary | ICD-10-CM | POA: Diagnosis not present

## 2022-09-20 DIAGNOSIS — H269 Unspecified cataract: Secondary | ICD-10-CM | POA: Diagnosis not present

## 2022-10-04 DIAGNOSIS — H2512 Age-related nuclear cataract, left eye: Secondary | ICD-10-CM | POA: Diagnosis not present

## 2022-10-04 DIAGNOSIS — H269 Unspecified cataract: Secondary | ICD-10-CM | POA: Diagnosis not present

## 2022-10-04 DIAGNOSIS — Z961 Presence of intraocular lens: Secondary | ICD-10-CM | POA: Diagnosis not present

## 2022-11-01 DIAGNOSIS — Z0100 Encounter for examination of eyes and vision without abnormal findings: Secondary | ICD-10-CM | POA: Diagnosis not present

## 2022-12-16 DIAGNOSIS — R053 Chronic cough: Secondary | ICD-10-CM | POA: Diagnosis not present

## 2022-12-16 DIAGNOSIS — Z6829 Body mass index (BMI) 29.0-29.9, adult: Secondary | ICD-10-CM | POA: Diagnosis not present

## 2023-04-18 DIAGNOSIS — H43812 Vitreous degeneration, left eye: Secondary | ICD-10-CM | POA: Diagnosis not present

## 2023-05-08 DIAGNOSIS — Z1379 Encounter for other screening for genetic and chromosomal anomalies: Secondary | ICD-10-CM | POA: Diagnosis not present

## 2023-06-06 DIAGNOSIS — R7303 Prediabetes: Secondary | ICD-10-CM | POA: Diagnosis not present

## 2023-06-06 DIAGNOSIS — I10 Essential (primary) hypertension: Secondary | ICD-10-CM | POA: Diagnosis not present

## 2023-06-06 DIAGNOSIS — E7800 Pure hypercholesterolemia, unspecified: Secondary | ICD-10-CM | POA: Diagnosis not present

## 2023-06-06 DIAGNOSIS — Z6830 Body mass index (BMI) 30.0-30.9, adult: Secondary | ICD-10-CM | POA: Diagnosis not present

## 2023-06-07 DIAGNOSIS — L814 Other melanin hyperpigmentation: Secondary | ICD-10-CM | POA: Diagnosis not present

## 2023-06-07 DIAGNOSIS — L570 Actinic keratosis: Secondary | ICD-10-CM | POA: Diagnosis not present

## 2023-06-07 DIAGNOSIS — D225 Melanocytic nevi of trunk: Secondary | ICD-10-CM | POA: Diagnosis not present

## 2023-06-07 DIAGNOSIS — L905 Scar conditions and fibrosis of skin: Secondary | ICD-10-CM | POA: Diagnosis not present

## 2023-06-07 DIAGNOSIS — L821 Other seborrheic keratosis: Secondary | ICD-10-CM | POA: Diagnosis not present

## 2023-06-27 DIAGNOSIS — D649 Anemia, unspecified: Secondary | ICD-10-CM | POA: Diagnosis not present

## 2023-06-27 DIAGNOSIS — I10 Essential (primary) hypertension: Secondary | ICD-10-CM | POA: Diagnosis not present

## 2023-06-29 DIAGNOSIS — H35363 Drusen (degenerative) of macula, bilateral: Secondary | ICD-10-CM | POA: Diagnosis not present

## 2023-07-14 DIAGNOSIS — M25551 Pain in right hip: Secondary | ICD-10-CM | POA: Diagnosis not present

## 2023-07-14 DIAGNOSIS — M25552 Pain in left hip: Secondary | ICD-10-CM | POA: Diagnosis not present

## 2023-07-14 DIAGNOSIS — M25652 Stiffness of left hip, not elsewhere classified: Secondary | ICD-10-CM | POA: Diagnosis not present

## 2023-07-14 DIAGNOSIS — M25651 Stiffness of right hip, not elsewhere classified: Secondary | ICD-10-CM | POA: Diagnosis not present

## 2023-07-14 DIAGNOSIS — M6281 Muscle weakness (generalized): Secondary | ICD-10-CM | POA: Diagnosis not present

## 2023-07-19 DIAGNOSIS — M25651 Stiffness of right hip, not elsewhere classified: Secondary | ICD-10-CM | POA: Diagnosis not present

## 2023-07-19 DIAGNOSIS — M25652 Stiffness of left hip, not elsewhere classified: Secondary | ICD-10-CM | POA: Diagnosis not present

## 2023-07-19 DIAGNOSIS — M25551 Pain in right hip: Secondary | ICD-10-CM | POA: Diagnosis not present

## 2023-07-19 DIAGNOSIS — M25552 Pain in left hip: Secondary | ICD-10-CM | POA: Diagnosis not present

## 2023-07-19 DIAGNOSIS — M6281 Muscle weakness (generalized): Secondary | ICD-10-CM | POA: Diagnosis not present

## 2023-08-01 DIAGNOSIS — C4401 Basal cell carcinoma of skin of lip: Secondary | ICD-10-CM | POA: Diagnosis not present

## 2023-08-01 DIAGNOSIS — L821 Other seborrheic keratosis: Secondary | ICD-10-CM | POA: Diagnosis not present

## 2023-08-01 DIAGNOSIS — L570 Actinic keratosis: Secondary | ICD-10-CM | POA: Diagnosis not present

## 2023-08-01 DIAGNOSIS — D485 Neoplasm of uncertain behavior of skin: Secondary | ICD-10-CM | POA: Diagnosis not present

## 2023-08-07 DIAGNOSIS — G35 Multiple sclerosis: Secondary | ICD-10-CM | POA: Diagnosis not present

## 2023-08-14 DIAGNOSIS — L570 Actinic keratosis: Secondary | ICD-10-CM | POA: Diagnosis not present

## 2023-08-16 DIAGNOSIS — D500 Iron deficiency anemia secondary to blood loss (chronic): Secondary | ICD-10-CM | POA: Diagnosis not present

## 2023-08-25 DIAGNOSIS — C4401 Basal cell carcinoma of skin of lip: Secondary | ICD-10-CM | POA: Diagnosis not present

## 2023-08-26 DIAGNOSIS — G35 Multiple sclerosis: Secondary | ICD-10-CM | POA: Diagnosis not present
